# Patient Record
Sex: Female | Born: 1980 | Race: Black or African American | Hispanic: No | Marital: Married | Smoking: Former smoker
Health system: Southern US, Community
[De-identification: ages and names within clinical notes are randomized; demographics above are authoritative.]

## PROBLEM LIST (undated history)

## (undated) ENCOUNTER — Inpatient Hospital Stay (HOSPITAL_COMMUNITY): Payer: Self-pay

## (undated) DIAGNOSIS — N946 Dysmenorrhea, unspecified: Secondary | ICD-10-CM

## (undated) DIAGNOSIS — R29898 Other symptoms and signs involving the musculoskeletal system: Secondary | ICD-10-CM

## (undated) DIAGNOSIS — H532 Diplopia: Secondary | ICD-10-CM

## (undated) DIAGNOSIS — Z789 Other specified health status: Secondary | ICD-10-CM

## (undated) DIAGNOSIS — R200 Anesthesia of skin: Secondary | ICD-10-CM

## (undated) DIAGNOSIS — G43909 Migraine, unspecified, not intractable, without status migrainosus: Secondary | ICD-10-CM

## (undated) HISTORY — PX: OTHER SURGICAL HISTORY: SHX169

## (undated) HISTORY — PX: DILATION AND CURETTAGE OF UTERUS: SHX78

## (undated) HISTORY — DX: Anesthesia of skin: R20.0

## (undated) HISTORY — PX: ROTATOR CUFF REPAIR: SHX139

## (undated) HISTORY — DX: Migraine, unspecified, not intractable, without status migrainosus: G43.909

## (undated) HISTORY — PX: BREAST LUMPECTOMY: SHX2

## (undated) HISTORY — DX: Dysmenorrhea, unspecified: N94.6

## (undated) HISTORY — DX: Diplopia: H53.2

## (undated) HISTORY — DX: Other symptoms and signs involving the musculoskeletal system: R29.898

---

## 2001-05-12 ENCOUNTER — Emergency Department (HOSPITAL_COMMUNITY): Admission: EM | Admit: 2001-05-12 | Discharge: 2001-05-13 | Payer: Self-pay | Admitting: Internal Medicine

## 2002-05-24 ENCOUNTER — Other Ambulatory Visit: Admission: RE | Admit: 2002-05-24 | Discharge: 2002-05-24 | Payer: Self-pay | Admitting: Obstetrics and Gynecology

## 2003-07-29 ENCOUNTER — Emergency Department (HOSPITAL_COMMUNITY): Admission: EM | Admit: 2003-07-29 | Discharge: 2003-07-29 | Payer: Self-pay

## 2003-10-23 ENCOUNTER — Emergency Department (HOSPITAL_COMMUNITY): Admission: EM | Admit: 2003-10-23 | Discharge: 2003-10-23 | Payer: Self-pay | Admitting: Emergency Medicine

## 2004-01-11 ENCOUNTER — Emergency Department (HOSPITAL_COMMUNITY): Admission: EM | Admit: 2004-01-11 | Discharge: 2004-01-11 | Payer: Self-pay | Admitting: Emergency Medicine

## 2004-11-02 ENCOUNTER — Emergency Department (HOSPITAL_COMMUNITY): Admission: EM | Admit: 2004-11-02 | Discharge: 2004-11-02 | Payer: Self-pay | Admitting: Emergency Medicine

## 2005-01-13 ENCOUNTER — Emergency Department (HOSPITAL_COMMUNITY): Admission: EM | Admit: 2005-01-13 | Discharge: 2005-01-13 | Payer: Self-pay | Admitting: Emergency Medicine

## 2005-11-27 ENCOUNTER — Other Ambulatory Visit: Admission: RE | Admit: 2005-11-27 | Discharge: 2005-11-27 | Payer: Self-pay | Admitting: Family Medicine

## 2006-03-21 ENCOUNTER — Emergency Department (HOSPITAL_COMMUNITY): Admission: EM | Admit: 2006-03-21 | Discharge: 2006-03-21 | Payer: Self-pay | Admitting: Emergency Medicine

## 2006-12-14 ENCOUNTER — Emergency Department (HOSPITAL_COMMUNITY): Admission: EM | Admit: 2006-12-14 | Discharge: 2006-12-14 | Payer: Self-pay | Admitting: Emergency Medicine

## 2006-12-18 ENCOUNTER — Inpatient Hospital Stay (HOSPITAL_COMMUNITY): Admission: AD | Admit: 2006-12-18 | Discharge: 2006-12-18 | Payer: Self-pay | Admitting: Obstetrics and Gynecology

## 2007-01-20 ENCOUNTER — Inpatient Hospital Stay (HOSPITAL_COMMUNITY): Admission: AD | Admit: 2007-01-20 | Discharge: 2007-01-20 | Payer: Self-pay | Admitting: Obstetrics and Gynecology

## 2007-01-26 ENCOUNTER — Ambulatory Visit: Payer: Self-pay | Admitting: Obstetrics and Gynecology

## 2007-01-26 ENCOUNTER — Encounter (INDEPENDENT_AMBULATORY_CARE_PROVIDER_SITE_OTHER): Payer: Self-pay | Admitting: *Deleted

## 2007-01-26 ENCOUNTER — Ambulatory Visit (HOSPITAL_COMMUNITY): Admission: RE | Admit: 2007-01-26 | Discharge: 2007-01-26 | Payer: Self-pay | Admitting: Obstetrics and Gynecology

## 2007-02-11 ENCOUNTER — Ambulatory Visit: Payer: Self-pay | Admitting: Family Medicine

## 2008-02-06 ENCOUNTER — Inpatient Hospital Stay (HOSPITAL_COMMUNITY): Admission: AD | Admit: 2008-02-06 | Discharge: 2008-02-06 | Payer: Self-pay | Admitting: Obstetrics & Gynecology

## 2008-03-16 ENCOUNTER — Ambulatory Visit (HOSPITAL_COMMUNITY): Admission: RE | Admit: 2008-03-16 | Discharge: 2008-03-16 | Payer: Self-pay | Admitting: Obstetrics

## 2008-06-16 ENCOUNTER — Inpatient Hospital Stay (HOSPITAL_COMMUNITY): Admission: AD | Admit: 2008-06-16 | Discharge: 2008-06-16 | Payer: Self-pay | Admitting: Obstetrics

## 2008-08-07 ENCOUNTER — Inpatient Hospital Stay (HOSPITAL_COMMUNITY): Admission: AD | Admit: 2008-08-07 | Discharge: 2008-08-07 | Payer: Self-pay | Admitting: Obstetrics

## 2008-08-10 ENCOUNTER — Inpatient Hospital Stay (HOSPITAL_COMMUNITY): Admission: AD | Admit: 2008-08-10 | Discharge: 2008-08-12 | Payer: Self-pay | Admitting: Obstetrics & Gynecology

## 2009-11-28 ENCOUNTER — Encounter: Admission: RE | Admit: 2009-11-28 | Discharge: 2009-11-28 | Payer: Self-pay | Admitting: Obstetrics

## 2010-05-29 ENCOUNTER — Encounter: Admission: RE | Admit: 2010-05-29 | Discharge: 2010-05-29 | Payer: Self-pay | Admitting: Obstetrics

## 2010-07-04 ENCOUNTER — Ambulatory Visit (HOSPITAL_BASED_OUTPATIENT_CLINIC_OR_DEPARTMENT_OTHER): Admission: RE | Admit: 2010-07-04 | Discharge: 2010-07-04 | Payer: Self-pay | Admitting: General Surgery

## 2011-01-02 LAB — CULTURE, ROUTINE-ABSCESS

## 2011-01-02 LAB — ANAEROBIC CULTURE

## 2011-03-07 NOTE — Op Note (Signed)
Shirley [name], Shirley [name]               ACCOUNT NO.:  [id number]   MEDICAL RECORD NO.:  [id number]          PATIENT TYPE:  AMB   LOCATION:  [facility]C                           FACILITY:  [facility]   PHYSICIAN:  Paticia Stack, MD     DATE OF BIRTH:  [birth year]   DATE OF PROCEDURE:  01/26/2007  DATE OF DISCHARGE:                               OPERATIVE REPORT   PREOPERATIVE DIAGNOSIS:  Missed abortion.   POSTOPERATIVE DIAGNOSIS:  Missed abortion.   PROCEDURE:  Suction D&E.   SURGEON:  Dr. Argentina Donovan   ASSISTANT:  Dr. Wilburt Finlay   ANESTHESIA:  Paracervical block and MAC.   SPECIMENS:  Products of conception sent to pathology.   ESTIMATED BLOOD LOSS:  Minimal.   COMPLICATIONS:  None.   FINDINGS:  A 10 weeks' size anteverted uterus with moderate products of  conception.   DESCRIPTION OF PROCEDURE:  The patient was taken to the operating room  where she was placed under MAC anesthesia. She was then placed in the  dorsal lithotomy position and prepped and draped in a normal sterile  fashion. A sterile speculum was placed in the patient's vagina and the  cervix was noted to be dilated to 1 cm. The anterior lip of the cervix  was grasped with a tenaculum. A total of 10 mL of Xylocaine was injected  at 3 and 9 o'clock position of the cervix to produce a paracervical  block. The uterine fundus was then gently sounded to 9 cm and then  serially dilated to an 8 Hegar.  An 8 mm suction curette was then gently  to the uterine fundus.  The suction device was activated and curette  rotated to clear the uterus of the products of conception. Three passes  were made with excellent results noted. There was minimal bleeding noted  and the tenaculum was removed with excellent hemostasis noted.  The  patient tolerated the procedure well and was taken to the recovery room  in stable condition.           ______________________________  Paticia Stack, MD     LNJ/MEDQ  D:  01/26/2007  T:  01/26/2007   Job:  [id number]

## 2011-03-07 NOTE — Group Therapy Note (Signed)
NAMEBRIEL, [name] NO.:  [id number]   MEDICAL RECORD NO.:  [id number]          PATIENT TYPE:  WOC   LOCATION:  [facility]                   FACILITY:  [facility]   PHYSICIAN:  Tinnie Gens, MD        DATE OF BIRTH:  August 11, 1981   DATE OF SERVICE:                                  CLINIC NOTE   CHIEF COMPLAINT:  Follow-up D&C for missed AB.   HISTORY OF PRESENT ILLNESS:  The patient is a 30 year old gravida 3,  para 1-0-2-1 who had a missed AB at 10 weeks, underwent D&C without  difficulty and comes in today for follow-up.  She has had no specific  bleeding.  No significant pain.   PAST MEDICAL HISTORY:  Negative.   PAST SURGICAL HISTORY:  1. D&C.  2. Shoulder repair.   MEDICATIONS:  None.   ALLERGIES:  NONE KNOWN.   FAMILY HISTORY:  Significant for stroke in her mother and hypertension  in her mother.   SOCIAL HISTORY:  Is social alcohol user on the weekends.  No tobacco, no  other drug use.   Fourteen point review of systems reviewed and is negative.   OBSTETRICAL HISTORY:  She has had one previous vaginal delivery.   GYNECOLOGICAL HISTORY:  No history of abnormal Pap smear.  Last Pap was  November 27, 2005.  Menarche at age 56, cycles are regular every 29 days,  lasts for approximately 5 days with medium pain, moderate flow.   EXAM:  Today weight 123 pounds, blood pressure 111/78, pulse is 80,  temperature 99.9.  She is a very thin black female, in no acute  distress.  ABDOMEN:  Soft, nontender, nondistended.  GU:  Normal external female genitalia.  Vagina is pink and rugated.  BUS  is normal.  Uterus is small, anteverted.  Cervix is posterior and  closed.  Uterus is nontender.  Adnexa without mass or tenderness.   Pathology is reviewed and shows products of conception, fetal tissue,  villi, and decidua.   IMPRESSION:  Status post missed AB.   PLAN:  Released back to regular activity.  Advised to use backup method  until her next cycle, she will  start NuvaRing at that time.  Advised not  to get pregnant for approximately three cycles.  Also explained ABs,  their usual cause, and normal history.           ______________________________  Tinnie Gens, MD     TP/MEDQ  D:  02/11/2007  T:  02/11/2007  Job:  [id number]

## 2011-07-15 LAB — WET PREP, GENITAL
Clue Cells Wet Prep HPF POC: NONE SEEN
Trich, Wet Prep: NONE SEEN
Yeast Wet Prep HPF POC: NONE SEEN

## 2011-07-15 LAB — POCT PREGNANCY, URINE
Operator id: 28886
Preg Test, Ur: POSITIVE

## 2011-07-15 LAB — URINALYSIS, ROUTINE W REFLEX MICROSCOPIC
Bilirubin Urine: NEGATIVE
Hgb urine dipstick: NEGATIVE
Specific Gravity, Urine: 1.025

## 2011-07-15 LAB — GC/CHLAMYDIA PROBE AMP, GENITAL
Chlamydia, DNA Probe: NEGATIVE
GC Probe Amp, Genital: NEGATIVE

## 2011-07-21 LAB — CBC
HCT: 29 — ABNORMAL LOW
MCHC: 33.2
MCV: 100.8 — ABNORMAL HIGH
MCV: 101.9 — ABNORMAL HIGH
Platelets: 188
RBC: 3.73 — ABNORMAL LOW
RDW: 14.6
RDW: 14.7
WBC: 8.6

## 2012-10-14 ENCOUNTER — Emergency Department (HOSPITAL_COMMUNITY)
Admission: EM | Admit: 2012-10-14 | Discharge: 2012-10-15 | Disposition: A | Discharge status: Home / Self Care | Payer: Medicaid Other | Attending: Emergency Medicine | Admitting: Emergency Medicine

## 2012-10-14 ENCOUNTER — Encounter (HOSPITAL_COMMUNITY): Payer: Self-pay | Admitting: *Deleted

## 2012-10-14 DIAGNOSIS — M79662 Pain in left lower leg: Secondary | ICD-10-CM

## 2012-10-14 DIAGNOSIS — M79609 Pain in unspecified limb: Secondary | ICD-10-CM | POA: Insufficient documentation

## 2012-10-14 DIAGNOSIS — M2550 Pain in unspecified joint: Secondary | ICD-10-CM | POA: Insufficient documentation

## 2012-10-14 NOTE — ED Notes (Signed)
Pt ambulates with steady gait to exam room.

## 2012-10-14 NOTE — ED Notes (Signed)
Pt [redacted] wks pregnant; c/o left shin pain x 2 days; pain shooting down to foot with ambulation; no obvious injury; ambulating without difficulty; no redness or swelling

## 2012-10-15 NOTE — ED Provider Notes (Signed)
History     CSN: [id number]  Arrival date & time 10/14/12  2232   First MD Initiated Contact with Patient 10/14/12 2307      No chief complaint on file.   (Consider location/radiation/quality/duration/timing/severity/associated sxs/prior treatment) HPI Shirley [name] is a 31 y.o. female who presents with complaint left lower leg pain. States pains started yesterday. No injury. Pain in anterior shin. Tender to palpation. Tender with walking. States walks a lot. Exercises daily. Denies running or pounding. No hx of the same. Pain shooting into arch of the foot. No rash, bruising, swelling, redness noted. No hx of the same. No swelling of the foot. No pain in the calf. Pt about [redacted]wks pregnant.  History reviewed. No pertinent past medical history.  Past Surgical History  Procedure Date  . Rotator cuff repair   . Breast lumpectomy     No family history on file.  History  Substance Use Topics  . Smoking status: Never Smoker   . Smokeless tobacco: Not on file  . Alcohol Use: No    OB History    Grav Para Term Preterm Abortions TAB SAB Ect Mult Living   1               Review of Systems  Constitutional: Negative for fever and chills.  Respiratory: Negative.   Cardiovascular: Negative.   Musculoskeletal: Positive for arthralgias.  Skin: Negative for color change and rash.  Neurological: Negative for weakness and numbness.    Allergies  Review of patient's allergies indicates no known allergies.  Home Medications   Current Outpatient Rx  Name  Route  Sig  Dispense  Refill  . PRENATAL MULTIVITAMIN CH   Oral   Take 1 tablet by mouth every morning.           BP 143/98  Pulse 84  Temp 98.1 F (36.7 C) (Oral)  Resp 20  Ht 5\' 10"  (1.778 m)  Wt 150 lb (68.04 kg)  BMI 21.52 kg/m2  SpO2 100%  Physical Exam  Nursing note and vitals reviewed. Constitutional: She is oriented to person, place, and time. She appears well-developed and well-nourished. No distress.    Cardiovascular: Normal rate, regular rhythm and normal heart sounds.   Pulmonary/Chest: Effort normal and breath sounds normal. No respiratory distress. She has no wheezes. She has no rales.  Musculoskeletal:       Normal appearing left leg with no swelling, redness, bruising to the left lower leg. No calf tenderness. Tender to the anterior middle portion of the shin over tibialis anterior muscle. Pain with foot dorsiflexion against resistance. 5/5 and equal strength with plantar and dorsiflexion of the foot. No abscess, no induration, no swelling noted. Dorsal pedal pulses.   Neurological: She is alert and oriented to person, place, and time.  Skin: Skin is warm and dry.  Psychiatric: She has a normal mood and affect. Her behavior is normal.    ED Course  Procedures (including critical care time)  Labs Reviewed - No data to display No results found.   1. Pain of left lower leg       MDM  Pt with non traumatic pain over tibialis anterior muscle from anterior shin into the arch of the foot where it attaches. She is afebrile, non toxic, doubt infection or deep abscess. Suspect either a muscle strain or shin splints. Pt is tender to even mild palpation over the skin, question possible early shingles. No rash seen however. Warning given to return if develops  rash. No imaging necessary at this time.         Lottie Mussel, PA 10/15/12 [phone]

## 2012-10-15 NOTE — ED Provider Notes (Signed)
Medical screening examination/treatment/procedure(s) were performed by non-physician practitioner and as supervising physician I was immediately available for consultation/collaboration.   Argyle Gustafson M Delman Goshorn, MD 10/15/12 1535 

## 2012-10-20 NOTE — L&D Delivery Note (Signed)
Delivery Note At 3:27 AM a viable female was delivered via Vaginal, Spontaneous Delivery (Presentation: Vertex - LOA ;  ).  APGAR: 8 - 9 , ; weight: 3765 grams .   Placenta status: Intact, Spontaneous.  Cord: 3 vessels with the following complications: None.  Cord pH: none  Anesthesia: Epidural  Episiotomy: None Lacerations: 2nd degree;Perineal Suture Repair: 2.0 chromic Est. Blood Loss (mL): 350  Mom to postpartum.  Baby to nursery-stable.  HARPER,CHARLES A 06/12/2013, 3:53 AM

## 2012-11-03 LAB — OB RESULTS CONSOLE HGB/HCT, BLOOD: Hemoglobin: 12.5 g/dL

## 2012-11-03 LAB — OB RESULTS CONSOLE PLATELET COUNT: Platelets: 232 10*3/uL

## 2012-11-03 LAB — OB RESULTS CONSOLE ABO/RH

## 2012-11-03 LAB — OB RESULTS CONSOLE RUBELLA ANTIBODY, IGM: Rubella: IMMUNE

## 2012-11-03 LAB — OB RESULTS CONSOLE ANTIBODY SCREEN: Antibody Screen: NEGATIVE

## 2012-11-03 LAB — OB RESULTS CONSOLE GC/CHLAMYDIA: Gonorrhea: NEGATIVE

## 2013-01-04 ENCOUNTER — Other Ambulatory Visit: Payer: Self-pay | Admitting: Obstetrics

## 2013-01-04 DIAGNOSIS — Z369 Encounter for antenatal screening, unspecified: Secondary | ICD-10-CM

## 2013-01-08 ENCOUNTER — Encounter (HOSPITAL_COMMUNITY): Payer: Self-pay | Admitting: Obstetrics and Gynecology

## 2013-01-08 ENCOUNTER — Inpatient Hospital Stay (HOSPITAL_COMMUNITY)
Admission: AD | Admit: 2013-01-08 | Discharge: 2013-01-08 | Disposition: A | Discharge status: Home / Self Care | Payer: Medicaid Other | Source: Ambulatory Visit | Attending: Obstetrics | Admitting: Obstetrics

## 2013-01-08 DIAGNOSIS — O99891 Other specified diseases and conditions complicating pregnancy: Secondary | ICD-10-CM | POA: Insufficient documentation

## 2013-01-08 DIAGNOSIS — N949 Unspecified condition associated with female genital organs and menstrual cycle: Secondary | ICD-10-CM

## 2013-01-08 DIAGNOSIS — R109 Unspecified abdominal pain: Secondary | ICD-10-CM | POA: Insufficient documentation

## 2013-01-08 LAB — URINALYSIS, ROUTINE W REFLEX MICROSCOPIC
Ketones, ur: NEGATIVE mg/dL
Leukocytes, UA: NEGATIVE
Protein, ur: NEGATIVE mg/dL

## 2013-01-08 NOTE — MAU Provider Note (Signed)
Chief Complaint  Patient presents with  . Abdominal Pain    S: Shirley [name] is a 32 y.o. [id number] at [redacted]w[redacted]d  presenting with several day hx of sharp crampy pain starting above umbilicus and now in right and left lower abdomen and groin. Now feels like pressure. The pain is not exacerbated by walking and changing positions. No dysuria, urgency or frequency. She denies contractions, vaginal bleeding or leakage of fluid. Fetus is active.  ROS: Negative except as noted above.  PN course uncomplicated  O: Filed Vitals:   01/08/13 1612  BP: 135/81  Pulse: 95  Temp: 98 F (36.7 C)  Resp: 18    Gen: NAD Abd: soft, mildly tender in lower abd and groin Back: neg CVAT Cx: L/C/H FHR:150 DT UCs: none  Results for orders placed during the hospital encounter of 01/08/13 (from the past 24 hour(s))  URINALYSIS, ROUTINE W REFLEX MICROSCOPIC     Status: None   Collection Time    01/08/13  4:13 PM      Result Value Range   Color, Urine YELLOW  YELLOW   APPearance CLEAR  CLEAR   Specific Gravity, Urine 1.025  1.005 - 1.030   pH 6.0  5.0 - 8.0   Glucose, UA NEGATIVE  NEGATIVE mg/dL   Hgb urine dipstick NEGATIVE  NEGATIVE   Bilirubin Urine NEGATIVE  NEGATIVE   Ketones, ur NEGATIVE  NEGATIVE mg/dL   Protein, ur NEGATIVE  NEGATIVE mg/dL   Urobilinogen, UA 0.2  0.0 - 1.0 mg/dL   Nitrite NEGATIVE  NEGATIVE   Leukocytes, UA NEGATIVE  NEGATIVE    ASSESSMENT: [id number] at [redacted]w[redacted]d Round Ligament Pain  PLAN:   Reassurance given and general relief measures reviewed: avoid precipitating movements, instructions on abdominal tightening/pelvic rock exercises, abdominal binder, rest with hip flexion. Follow-up Information   Follow up with HARPER,CHARLES A, MD On 01/18/2013.   Contact information:   [address] [place] [address] [phone]

## 2013-01-08 NOTE — MAU Note (Signed)
Shirley [name] is here today with abdominal pain. She is [redacted]w[redacted]d; receives her care at [facility]. Says she was walking to the store and started having abdominal pain.

## 2013-01-14 ENCOUNTER — Encounter: Payer: Self-pay | Admitting: *Deleted

## 2013-01-18 ENCOUNTER — Encounter: Payer: Self-pay | Admitting: Obstetrics

## 2013-01-18 ENCOUNTER — Other Ambulatory Visit: Payer: Self-pay | Admitting: Obstetrics

## 2013-01-18 ENCOUNTER — Ambulatory Visit (INDEPENDENT_AMBULATORY_CARE_PROVIDER_SITE_OTHER): Payer: Medicaid Other

## 2013-01-18 ENCOUNTER — Other Ambulatory Visit: Payer: Self-pay

## 2013-01-18 ENCOUNTER — Ambulatory Visit (INDEPENDENT_AMBULATORY_CARE_PROVIDER_SITE_OTHER): Payer: Medicaid Other | Admitting: Obstetrics

## 2013-01-18 VITALS — BP 125/82 | Temp 97.2°F | Wt 157.0 lb

## 2013-01-18 DIAGNOSIS — Z3480 Encounter for supervision of other normal pregnancy, unspecified trimester: Secondary | ICD-10-CM

## 2013-01-18 DIAGNOSIS — Z369 Encounter for antenatal screening, unspecified: Secondary | ICD-10-CM

## 2013-01-18 LAB — POCT URINALYSIS DIPSTICK
Bilirubin, UA: NEGATIVE
Glucose, UA: NEGATIVE
Leukocytes, UA: NEGATIVE
Nitrite, UA: NEGATIVE
Urobilinogen, UA: NEGATIVE

## 2013-01-18 LAB — US OB DETAIL + 14 WK

## 2013-01-18 NOTE — Progress Notes (Signed)
Doing well 

## 2013-01-18 NOTE — Progress Notes (Signed)
Pulse-76 Ultrasound done prior to appointment.

## 2013-02-02 ENCOUNTER — Ambulatory Visit (INDEPENDENT_AMBULATORY_CARE_PROVIDER_SITE_OTHER): Payer: Medicaid Other | Admitting: Obstetrics

## 2013-02-02 VITALS — BP 125/82 | Temp 98.2°F | Wt 163.6 lb

## 2013-02-02 DIAGNOSIS — Z3480 Encounter for supervision of other normal pregnancy, unspecified trimester: Secondary | ICD-10-CM

## 2013-02-02 DIAGNOSIS — Z3482 Encounter for supervision of other normal pregnancy, second trimester: Secondary | ICD-10-CM

## 2013-02-02 LAB — POCT URINALYSIS DIPSTICK
Glucose, UA: NEGATIVE
Nitrite, UA: NEGATIVE
Protein, UA: NEGATIVE

## 2013-02-02 NOTE — Progress Notes (Signed)
Pulse: 76

## 2013-03-07 ENCOUNTER — Ambulatory Visit (INDEPENDENT_AMBULATORY_CARE_PROVIDER_SITE_OTHER): Payer: Medicaid Other | Admitting: Obstetrics

## 2013-03-07 ENCOUNTER — Other Ambulatory Visit: Payer: Medicaid Other | Admitting: *Deleted

## 2013-03-07 ENCOUNTER — Encounter: Payer: Self-pay | Admitting: Obstetrics

## 2013-03-07 VITALS — BP 127/83 | Temp 98.4°F | Wt 166.6 lb

## 2013-03-07 DIAGNOSIS — Z3480 Encounter for supervision of other normal pregnancy, unspecified trimester: Secondary | ICD-10-CM

## 2013-03-07 DIAGNOSIS — Z3482 Encounter for supervision of other normal pregnancy, second trimester: Secondary | ICD-10-CM

## 2013-03-07 DIAGNOSIS — J302 Other seasonal allergic rhinitis: Secondary | ICD-10-CM | POA: Insufficient documentation

## 2013-03-07 DIAGNOSIS — N949 Unspecified condition associated with female genital organs and menstrual cycle: Secondary | ICD-10-CM | POA: Insufficient documentation

## 2013-03-07 DIAGNOSIS — J309 Allergic rhinitis, unspecified: Secondary | ICD-10-CM

## 2013-03-07 DIAGNOSIS — K219 Gastro-esophageal reflux disease without esophagitis: Secondary | ICD-10-CM

## 2013-03-07 LAB — POCT URINALYSIS DIPSTICK
Blood, UA: NEGATIVE
Glucose, UA: NEGATIVE
Ketones, UA: NEGATIVE
Spec Grav, UA: 1.015
Urobilinogen, UA: NEGATIVE

## 2013-03-07 LAB — CBC
Hemoglobin: 11 g/dL — ABNORMAL LOW (ref 12.0–15.0)
MCH: 32.5 pg (ref 26.0–34.0)
MCHC: 34 g/dL (ref 30.0–36.0)
MCV: 95.9 fL (ref 78.0–100.0)
Platelets: 237 10*3/uL (ref 150–400)
RBC: 3.38 MIL/uL — ABNORMAL LOW (ref 3.87–5.11)

## 2013-03-07 MED ORDER — LORATADINE 10 MG PO TABS: 10.0000 mg | ORAL_TABLET | Freq: Every day | ORAL | Status: DC

## 2013-03-07 MED ORDER — OMEPRAZOLE 20 MG PO CPDR: DELAYED_RELEASE_CAPSULE | ORAL | Status: DC

## 2013-03-07 NOTE — Progress Notes (Signed)
Pulse- 94 

## 2013-03-07 NOTE — Addendum Note (Signed)
Addended by: Coral Ceo A on: 03/07/2013 12:57 PM   Modules accepted: Orders

## 2013-03-08 LAB — GLUCOSE TOLERANCE, 2 HOURS W/ 1HR
Glucose, 1 hour: 116 mg/dL (ref 70–170)
Glucose, Fasting: 57 mg/dL — ABNORMAL LOW (ref 70–99)

## 2013-03-08 LAB — HIV ANTIBODY (ROUTINE TESTING W REFLEX): HIV: NONREACTIVE

## 2013-03-21 ENCOUNTER — Ambulatory Visit (INDEPENDENT_AMBULATORY_CARE_PROVIDER_SITE_OTHER): Payer: Medicaid Other | Admitting: Obstetrics

## 2013-03-21 ENCOUNTER — Encounter: Payer: Self-pay | Admitting: Obstetrics

## 2013-03-21 VITALS — BP 132/76 | Temp 97.4°F | Wt 171.0 lb

## 2013-03-21 DIAGNOSIS — Z3480 Encounter for supervision of other normal pregnancy, unspecified trimester: Secondary | ICD-10-CM

## 2013-03-21 DIAGNOSIS — Z3483 Encounter for supervision of other normal pregnancy, third trimester: Secondary | ICD-10-CM

## 2013-03-21 DIAGNOSIS — Z3482 Encounter for supervision of other normal pregnancy, second trimester: Secondary | ICD-10-CM

## 2013-03-21 DIAGNOSIS — N390 Urinary tract infection, site not specified: Secondary | ICD-10-CM

## 2013-03-21 DIAGNOSIS — R319 Hematuria, unspecified: Secondary | ICD-10-CM

## 2013-03-21 LAB — POCT URINALYSIS DIPSTICK
Leukocytes, UA: NEGATIVE
Nitrite, UA: NEGATIVE
Urobilinogen, UA: NEGATIVE

## 2013-03-21 NOTE — Progress Notes (Signed)
Pulse-87  No complaints  

## 2013-03-23 ENCOUNTER — Encounter: Payer: Self-pay | Admitting: Obstetrics

## 2013-03-23 LAB — CULTURE, OB URINE: Colony Count: NO GROWTH

## 2013-03-29 ENCOUNTER — Encounter: Payer: Self-pay | Admitting: Obstetrics

## 2013-03-29 LAB — US OB DETAIL + 14 WK

## 2013-03-30 DIAGNOSIS — Z3480 Encounter for supervision of other normal pregnancy, unspecified trimester: Secondary | ICD-10-CM

## 2013-04-05 ENCOUNTER — Ambulatory Visit (INDEPENDENT_AMBULATORY_CARE_PROVIDER_SITE_OTHER): Payer: Medicaid Other | Admitting: Obstetrics

## 2013-04-05 ENCOUNTER — Encounter: Payer: Self-pay | Admitting: Obstetrics

## 2013-04-05 VITALS — BP 129/86 | Temp 98.1°F | Wt 175.4 lb

## 2013-04-05 DIAGNOSIS — Z3480 Encounter for supervision of other normal pregnancy, unspecified trimester: Secondary | ICD-10-CM

## 2013-04-05 DIAGNOSIS — R319 Hematuria, unspecified: Secondary | ICD-10-CM

## 2013-04-05 DIAGNOSIS — Z3483 Encounter for supervision of other normal pregnancy, third trimester: Secondary | ICD-10-CM

## 2013-04-05 LAB — POCT URINALYSIS DIPSTICK
Bilirubin, UA: NEGATIVE
Ketones, UA: NEGATIVE
Spec Grav, UA: 1.015
pH, UA: 6.5

## 2013-04-05 NOTE — Progress Notes (Signed)
Pulse-94 No complaints.  

## 2013-04-07 LAB — CULTURE, OB URINE: Colony Count: 90000

## 2013-04-19 ENCOUNTER — Ambulatory Visit (INDEPENDENT_AMBULATORY_CARE_PROVIDER_SITE_OTHER): Payer: Medicaid Other | Admitting: Obstetrics

## 2013-04-19 VITALS — BP 126/86 | Temp 97.4°F | Wt 179.0 lb

## 2013-04-19 DIAGNOSIS — Z3483 Encounter for supervision of other normal pregnancy, third trimester: Secondary | ICD-10-CM

## 2013-04-19 DIAGNOSIS — Z3480 Encounter for supervision of other normal pregnancy, unspecified trimester: Secondary | ICD-10-CM

## 2013-04-19 LAB — POCT URINALYSIS DIPSTICK
Glucose, UA: NEGATIVE
Leukocytes, UA: NEGATIVE
Nitrite, UA: NEGATIVE
pH, UA: 6

## 2013-04-19 NOTE — Progress Notes (Signed)
Pulse-80 Pt c/o intermittent pelvic pain x 2 days ago.

## 2013-05-05 ENCOUNTER — Encounter: Payer: Medicaid Other | Admitting: Obstetrics

## 2013-05-12 ENCOUNTER — Encounter: Payer: Self-pay | Admitting: Obstetrics

## 2013-05-12 ENCOUNTER — Ambulatory Visit (INDEPENDENT_AMBULATORY_CARE_PROVIDER_SITE_OTHER): Payer: Medicaid Other | Admitting: Obstetrics

## 2013-05-12 VITALS — BP 130/85 | Temp 98.2°F | Wt 186.0 lb

## 2013-05-12 DIAGNOSIS — Z3483 Encounter for supervision of other normal pregnancy, third trimester: Secondary | ICD-10-CM

## 2013-05-12 DIAGNOSIS — Z3480 Encounter for supervision of other normal pregnancy, unspecified trimester: Secondary | ICD-10-CM

## 2013-05-12 LAB — POCT URINALYSIS DIPSTICK
Bilirubin, UA: NEGATIVE
Blood, UA: NEGATIVE
Glucose, UA: NEGATIVE
Leukocytes, UA: NEGATIVE
Nitrite, UA: NEGATIVE
Urobilinogen, UA: NEGATIVE

## 2013-05-12 NOTE — Progress Notes (Signed)
Pulse- 87 

## 2013-05-19 ENCOUNTER — Encounter: Payer: Medicaid Other | Admitting: Obstetrics

## 2013-05-24 ENCOUNTER — Ambulatory Visit (INDEPENDENT_AMBULATORY_CARE_PROVIDER_SITE_OTHER): Payer: Medicaid Other | Admitting: Advanced Practice Midwife

## 2013-05-24 VITALS — BP 125/85 | Temp 98.1°F | Wt 191.0 lb

## 2013-05-24 DIAGNOSIS — Z3483 Encounter for supervision of other normal pregnancy, third trimester: Secondary | ICD-10-CM

## 2013-05-24 DIAGNOSIS — Z3480 Encounter for supervision of other normal pregnancy, unspecified trimester: Secondary | ICD-10-CM

## 2013-05-24 LAB — POCT URINALYSIS DIPSTICK
Nitrite, UA: NEGATIVE
Spec Grav, UA: 1.01
Urobilinogen, UA: NEGATIVE

## 2013-05-24 NOTE — Progress Notes (Signed)
Pulse: 84

## 2013-05-24 NOTE — Progress Notes (Signed)
Routine Obstetrical Visit  Subjective:    Shirley [name] is being seen today for her routine obstetrical visit. She is at [redacted]w[redacted]d gestation.   Patient reports no bleeding, no contractions, no cramping and no leaking. Denies PIH symptoms.   Objective:     Temp(Src) 98.1 F (36.7 C)  Wt 191 lb (86.637 kg)  BMI 27.41 kg/m2  FHR 140 FH 37 SVE 2/70/-3 posterior   Assessment:    Pregnancy: [id number] Patient Active Problem List   Diagnosis Date Noted  . Unspecified symptom associated with female genital organs 03/07/2013  . Allergic rhinitis, seasonal 03/07/2013  . GERD (gastroesophageal reflux disease) 03/07/2013       Plan:     Prenatal vitamins. Problem list reviewed and updated.  Patient reported she desires IOL @ 39 weeks if still pregnant as previously discussed w/ MD Clearance Coots. I instructed patient to meet with him next visit to discuss this plan. GBS today Follow up in 1 weeks. 80% of 15 min visit spent on counseling and coordination of care.     Jazper Nikolai 05/24/2013

## 2013-05-26 LAB — STREP B DNA PROBE: GBSP: NEGATIVE

## 2013-05-31 ENCOUNTER — Encounter: Payer: Self-pay | Admitting: Obstetrics

## 2013-05-31 ENCOUNTER — Ambulatory Visit (INDEPENDENT_AMBULATORY_CARE_PROVIDER_SITE_OTHER): Payer: Medicaid Other | Admitting: Obstetrics

## 2013-05-31 VITALS — BP 133/88 | Temp 97.5°F | Wt 192.5 lb

## 2013-05-31 DIAGNOSIS — Z3480 Encounter for supervision of other normal pregnancy, unspecified trimester: Secondary | ICD-10-CM

## 2013-05-31 DIAGNOSIS — Z3483 Encounter for supervision of other normal pregnancy, third trimester: Secondary | ICD-10-CM

## 2013-05-31 LAB — POCT URINALYSIS DIPSTICK
Bilirubin, UA: NEGATIVE
Blood, UA: NEGATIVE
Ketones, UA: NEGATIVE
Nitrite, UA: NEGATIVE
pH, UA: 7

## 2013-05-31 NOTE — Progress Notes (Signed)
Pulse-84 No complaints 

## 2013-06-04 ENCOUNTER — Encounter (HOSPITAL_COMMUNITY): Payer: Self-pay | Admitting: *Deleted

## 2013-06-04 ENCOUNTER — Inpatient Hospital Stay (HOSPITAL_COMMUNITY)
Admission: AD | Admit: 2013-06-04 | Discharge: 2013-06-04 | Disposition: A | Discharge status: Home / Self Care | Payer: Medicaid Other | Source: Ambulatory Visit | Attending: Obstetrics | Admitting: Obstetrics

## 2013-06-04 DIAGNOSIS — O479 False labor, unspecified: Secondary | ICD-10-CM | POA: Insufficient documentation

## 2013-06-04 NOTE — MAU Note (Signed)
PT SAYS SHE HURT BAD   SINCE 926PM,     VE IN OFFICE 2 WEEKS AGO-  3 CM.    DENIES HSV AND MRSA  GBS- NEG.

## 2013-06-07 ENCOUNTER — Ambulatory Visit (INDEPENDENT_AMBULATORY_CARE_PROVIDER_SITE_OTHER): Payer: Medicaid Other | Admitting: Obstetrics

## 2013-06-07 VITALS — BP 130/87 | Temp 98.1°F | Wt 197.0 lb

## 2013-06-07 DIAGNOSIS — Z3480 Encounter for supervision of other normal pregnancy, unspecified trimester: Secondary | ICD-10-CM

## 2013-06-07 DIAGNOSIS — Z3483 Encounter for supervision of other normal pregnancy, third trimester: Secondary | ICD-10-CM

## 2013-06-07 LAB — POCT URINALYSIS DIPSTICK
Bilirubin, UA: NEGATIVE
Blood, UA: NEGATIVE
Glucose, UA: NEGATIVE
Spec Grav, UA: 1.01

## 2013-06-07 NOTE — Progress Notes (Signed)
P-90 Pt states she had some spotting but it has stopped. Pt states she is having pain and pressure in her vagina.

## 2013-06-08 ENCOUNTER — Encounter: Payer: Self-pay | Admitting: Obstetrics

## 2013-06-12 ENCOUNTER — Encounter (HOSPITAL_COMMUNITY): Payer: Self-pay | Admitting: Anesthesiology

## 2013-06-12 ENCOUNTER — Inpatient Hospital Stay (HOSPITAL_COMMUNITY)
Admission: AD | Admit: 2013-06-12 | Discharge: 2013-06-13 | DRG: 775 | Disposition: A | Discharge status: Home / Self Care | Payer: Medicaid Other | Source: Ambulatory Visit | Attending: Obstetrics | Admitting: Obstetrics

## 2013-06-12 ENCOUNTER — Encounter (HOSPITAL_COMMUNITY): Payer: Self-pay | Admitting: *Deleted

## 2013-06-12 ENCOUNTER — Inpatient Hospital Stay (HOSPITAL_COMMUNITY): Payer: Medicaid Other | Admitting: Anesthesiology

## 2013-06-12 DIAGNOSIS — O139 Gestational [pregnancy-induced] hypertension without significant proteinuria, unspecified trimester: Principal | ICD-10-CM | POA: Diagnosis present

## 2013-06-12 LAB — CBC
HCT: 33.7 % — ABNORMAL LOW (ref 36.0–46.0)
MCH: 31.8 pg (ref 26.0–34.0)
MCH: 32.4 pg (ref 26.0–34.0)
MCHC: 33.8 g/dL (ref 30.0–36.0)
MCV: 93.9 fL (ref 78.0–100.0)
Platelets: 223 10*3/uL (ref 150–400)
Platelets: 200 10*3/uL (ref 150–400)
RBC: 3.3 MIL/uL — ABNORMAL LOW (ref 3.87–5.11)
RDW: 15.1 % (ref 11.5–15.5)
WBC: 10.1 10*3/uL (ref 4.0–10.5)
WBC: 15.4 10*3/uL — ABNORMAL HIGH (ref 4.0–10.5)

## 2013-06-12 MED ORDER — LACTATED RINGERS IV SOLN
INTRAVENOUS | Status: DC
  Administered 2013-06-12: 01:00:00 via INTRAVENOUS

## 2013-06-12 MED ORDER — BENZOCAINE-MENTHOL 20-0.5 % EX AERO
1.0000 "application " | INHALATION_SPRAY | CUTANEOUS | Status: DC | PRN
  Administered 2013-06-12 – 2013-06-13 (×2): 1 via TOPICAL
  Filled 2013-06-12 (×2): qty 56

## 2013-06-12 MED ORDER — ACETAMINOPHEN 325 MG PO TABS: 650.0000 mg | ORAL_TABLET | ORAL | Status: DC | PRN

## 2013-06-12 MED ORDER — PRENATAL MULTIVITAMIN CH
1.0000 | ORAL_TABLET | Freq: Every day | ORAL | Status: DC
  Administered 2013-06-12 – 2013-06-13 (×2): 1 via ORAL
  Filled 2013-06-12 (×2): qty 1

## 2013-06-12 MED ORDER — ZOLPIDEM TARTRATE 5 MG PO TABS: 5.0000 mg | ORAL_TABLET | Freq: Every evening | ORAL | Status: DC | PRN

## 2013-06-12 MED ORDER — EPHEDRINE 5 MG/ML INJ
10.0000 mg | INTRAVENOUS | Status: DC | PRN
  Filled 2013-06-12: qty 4
  Filled 2013-06-12: qty 2

## 2013-06-12 MED ORDER — LACTATED RINGERS IV SOLN: 500.0000 mL | INTRAVENOUS | Status: DC | PRN

## 2013-06-12 MED ORDER — FENTANYL 2.5 MCG/ML BUPIVACAINE 1/10 % EPIDURAL INFUSION (WH - ANES)
14.0000 mL/h | INTRAMUSCULAR | Status: DC | PRN
  Administered 2013-06-12: 16 mL/h via EPIDURAL
  Filled 2013-06-12: qty 125

## 2013-06-12 MED ORDER — IBUPROFEN 600 MG PO TABS
600.0000 mg | ORAL_TABLET | Freq: Four times a day (QID) | ORAL | Status: DC
  Administered 2013-06-12 – 2013-06-13 (×6): 600 mg via ORAL
  Filled 2013-06-12 (×6): qty 1

## 2013-06-12 MED ORDER — OXYTOCIN 40 UNITS IN LACTATED RINGERS INFUSION - SIMPLE MED
62.5000 mL/h | INTRAVENOUS | Status: DC
  Administered 2013-06-12: 62.5 mL/h via INTRAVENOUS
  Filled 2013-06-12: qty 1000

## 2013-06-12 MED ORDER — PHENYLEPHRINE 40 MCG/ML (10ML) SYRINGE FOR IV PUSH (FOR BLOOD PRESSURE SUPPORT)
80.0000 ug | PREFILLED_SYRINGE | INTRAVENOUS | Status: DC | PRN
  Filled 2013-06-12: qty 5
  Filled 2013-06-12: qty 2

## 2013-06-12 MED ORDER — DIPHENHYDRAMINE HCL 50 MG/ML IJ SOLN: 12.5000 mg | INTRAMUSCULAR | Status: DC | PRN

## 2013-06-12 MED ORDER — OXYTOCIN 40 UNITS IN LACTATED RINGERS INFUSION - SIMPLE MED: 62.5000 mL/h | INTRAVENOUS | Status: DC | PRN

## 2013-06-12 MED ORDER — LANOLIN HYDROUS EX OINT: TOPICAL_OINTMENT | CUTANEOUS | Status: DC | PRN

## 2013-06-12 MED ORDER — PHENYLEPHRINE 40 MCG/ML (10ML) SYRINGE FOR IV PUSH (FOR BLOOD PRESSURE SUPPORT)
80.0000 ug | PREFILLED_SYRINGE | INTRAVENOUS | Status: DC | PRN
  Filled 2013-06-12: qty 2

## 2013-06-12 MED ORDER — OXYCODONE-ACETAMINOPHEN 5-325 MG PO TABS: 1.0000 | ORAL_TABLET | ORAL | Status: DC | PRN

## 2013-06-12 MED ORDER — ONDANSETRON HCL 4 MG/2ML IJ SOLN: 4.0000 mg | Freq: Four times a day (QID) | INTRAMUSCULAR | Status: DC | PRN

## 2013-06-12 MED ORDER — IBUPROFEN 600 MG PO TABS: 600.0000 mg | ORAL_TABLET | Freq: Four times a day (QID) | ORAL | Status: DC | PRN

## 2013-06-12 MED ORDER — LIDOCAINE HCL (PF) 1 % IJ SOLN
30.0000 mL | INTRAMUSCULAR | Status: DC | PRN
  Administered 2013-06-12: 30 mL via SUBCUTANEOUS
  Filled 2013-06-12 (×2): qty 30

## 2013-06-12 MED ORDER — SENNOSIDES-DOCUSATE SODIUM 8.6-50 MG PO TABS
2.0000 | ORAL_TABLET | Freq: Every day | ORAL | Status: DC
  Administered 2013-06-12: 2 via ORAL

## 2013-06-12 MED ORDER — DIPHENHYDRAMINE HCL 25 MG PO CAPS: 25.0000 mg | ORAL_CAPSULE | Freq: Four times a day (QID) | ORAL | Status: DC | PRN

## 2013-06-12 MED ORDER — LACTATED RINGERS IV SOLN
500.0000 mL | Freq: Once | INTRAVENOUS | Status: AC
  Administered 2013-06-12: 500 mL via INTRAVENOUS

## 2013-06-12 MED ORDER — WITCH HAZEL-GLYCERIN EX PADS: 1.0000 "application " | MEDICATED_PAD | CUTANEOUS | Status: DC | PRN

## 2013-06-12 MED ORDER — OXYTOCIN BOLUS FROM INFUSION: 500.0000 mL | INTRAVENOUS | Status: DC

## 2013-06-12 MED ORDER — LIDOCAINE HCL (PF) 1 % IJ SOLN
INTRAMUSCULAR | Status: DC | PRN
  Administered 2013-06-12: 4 mL
  Administered 2013-06-12: 2 mL
  Administered 2013-06-12 (×3): 4 mL

## 2013-06-12 MED ORDER — SIMETHICONE 80 MG PO CHEW: 80.0000 mg | CHEWABLE_TABLET | ORAL | Status: DC | PRN

## 2013-06-12 MED ORDER — ONDANSETRON HCL 4 MG PO TABS: 4.0000 mg | ORAL_TABLET | ORAL | Status: DC | PRN

## 2013-06-12 MED ORDER — DIBUCAINE 1 % RE OINT: 1.0000 "application " | TOPICAL_OINTMENT | RECTAL | Status: DC | PRN

## 2013-06-12 MED ORDER — TETANUS-DIPHTH-ACELL PERTUSSIS 5-2.5-18.5 LF-MCG/0.5 IM SUSP: 0.5000 mL | Freq: Once | INTRAMUSCULAR | Status: DC

## 2013-06-12 MED ORDER — ONDANSETRON HCL 4 MG/2ML IJ SOLN: 4.0000 mg | INTRAMUSCULAR | Status: DC | PRN

## 2013-06-12 MED ORDER — OXYCODONE-ACETAMINOPHEN 5-325 MG PO TABS
1.0000 | ORAL_TABLET | ORAL | Status: DC | PRN
  Administered 2013-06-12 – 2013-06-13 (×4): 1 via ORAL
  Filled 2013-06-12 (×4): qty 1

## 2013-06-12 MED ORDER — CITRIC ACID-SODIUM CITRATE 334-500 MG/5ML PO SOLN: 30.0000 mL | ORAL | Status: DC | PRN

## 2013-06-12 MED ORDER — EPHEDRINE 5 MG/ML INJ
10.0000 mg | INTRAVENOUS | Status: DC | PRN
  Filled 2013-06-12: qty 2

## 2013-06-12 NOTE — MAU Note (Signed)
Contractions for 1.5hrs. No bleeding or leaking

## 2013-06-12 NOTE — Progress Notes (Signed)
Report called to [facility] in [facility]. Pt to BS via w/c for labor eval from triage.

## 2013-06-12 NOTE — Progress Notes (Signed)
Post Partum Day 0 Subjective: no complaints  Objective: Blood pressure 122/92, pulse 85, temperature 98.4 F (36.9 C), temperature source Oral, resp. rate 18, height 5\' 10"  (1.778 m), weight 196 lb 12.8 oz (89.268 kg), SpO2 99.00%, unknown if currently breastfeeding.  Physical Exam:  General: alert and no distress Lochia: appropriate Uterine Fundus: firm Incision: healing well DVT Evaluation: No evidence of DVT seen on physical exam.   Recent Labs  06/12/13 0040 06/12/13 0635  HGB 11.4* 10.7*  HCT 33.7* 31.4*    Assessment/Plan: Plan for discharge tomorrow   LOS: 0 days   Shirley [name] A 06/12/2013, 8:24 AM

## 2013-06-12 NOTE — Anesthesia Procedure Notes (Signed)
Epidural Patient location during procedure: OB Start time: 06/12/2013 1:17 AM  Staffing Performed by: anesthesiologist   Preanesthetic Checklist Completed: patient identified, site marked, surgical consent, pre-op evaluation, timeout performed, IV checked, risks and benefits discussed and monitors and equipment checked  Epidural Patient position: sitting Prep: site prepped and draped and DuraPrep Patient monitoring: continuous pulse ox and blood pressure Approach: midline Injection technique: LOR air  Needle:  Needle type: Tuohy  Needle gauge: 17 G Needle length: 9 cm and 9 Needle insertion depth: 4.5 cm Catheter type: closed end flexible Catheter size: 19 Gauge Catheter at skin depth: 9.5 cm Test dose: negative  Assessment Events: blood not aspirated, injection not painful, no injection resistance, negative IV test and no paresthesia  Additional Notes Discussed risk of headache, infection, bleeding, nerve injury and failed or incomplete block.  Patient voices understanding and wishes to proceed.  Epidural placed easily on first attempt.  No paresthesia. Patient tolerated procedure well with no apparent complications.  Jasmine December, MDReason for block:procedure for pain

## 2013-06-12 NOTE — Anesthesia Preprocedure Evaluation (Signed)
Anesthesia Evaluation  Patient identified by MRN, date of birth, ID band Patient awake    Reviewed: Allergy & Precautions, H&P , NPO status , Patient's Chart, lab work & pertinent test results, reviewed documented beta blocker date and time   History of Anesthesia Complications Negative for: history of anesthetic complications  Airway Mallampati: III TM Distance: >3 FB Neck ROM: full    Dental  (+) Teeth Intact   Pulmonary neg pulmonary ROS,  breath sounds clear to auscultation        Cardiovascular negative cardio ROS  Rhythm:regular Rate:Normal     Neuro/Psych negative neurological ROS  negative psych ROS   GI/Hepatic Neg liver ROS, GERD-  Medicated,  Endo/Other  negative endocrine ROS  Renal/GU negative Renal ROS     Musculoskeletal   Abdominal   Peds  Hematology negative hematology ROS (+)   Anesthesia Other Findings   Reproductive/Obstetrics (+) Pregnancy                           Anesthesia Physical Anesthesia Plan  ASA: II  Anesthesia Plan: Epidural   Post-op Pain Management:    Induction:   Airway Management Planned:   Additional Equipment:   Intra-op Plan:   Post-operative Plan:   Informed Consent: I have reviewed the patients History and Physical, chart, labs and discussed the procedure including the risks, benefits and alternatives for the proposed anesthesia with the patient or authorized representative who has indicated his/her understanding and acceptance.     Plan Discussed with:   Anesthesia Plan Comments:         Anesthesia Quick Evaluation  

## 2013-06-12 NOTE — Anesthesia Postprocedure Evaluation (Signed)
  Anesthesia Post-op Note  Patient: Shirley [name]  Procedure(s) Performed: * No procedures listed *  Patient Location: PACU and Mother/Baby  Anesthesia Type:Epidural  Level of Consciousness: awake, alert  and oriented  Airway and Oxygen Therapy: Patient Spontanous Breathing  Post-op Pain: mild  Post-op Assessment: Patient's Cardiovascular Status Stable, Respiratory Function Stable, Patent Airway, No signs of Nausea or vomiting and Pain level controlled  Post-op Vital Signs: stable  Complications: No apparent anesthesia complications

## 2013-06-12 NOTE — H&P (Signed)
Shirley [name] is a 32 y.o. female presenting for UC's. Maternal Medical History:  Reason for admission: Contractions.  32 yo G5 P2022.  EDC 06-20-13.  Presents with UC's.  Fetal activity: Perceived fetal activity is normal.   Last perceived fetal movement was within the past hour.    Prenatal complications: no prenatal complications Prenatal Complications - Diabetes: none.    OB History   Grav Para Term Preterm Abortions TAB SAB Ect Mult Living   5 2 2  2  0 1   2     History reviewed. No pertinent past medical history. Past Surgical History  Procedure Laterality Date  . Rotator cuff repair    . Breast lumpectomy    . Dilation and curettage of uterus    . Rods in feet     Family History: family history is not on file. Social History:  reports that she has never smoked. She does not have any smokeless tobacco history on file. She reports that she does not drink alcohol or use illicit drugs.   Prenatal Transfer Tool  Maternal Diabetes: No Genetic Screening: Normal Maternal Ultrasounds/Referrals: Normal Fetal Ultrasounds or other Referrals:  None Maternal Substance Abuse:  No Significant Maternal Medications:  None Significant Maternal Lab Results:  None Other Comments:  None  Review of Systems  All other systems reviewed and are negative.    Dilation: 7 Effacement (%): 100 Station: -2 Exam by:: A. Tuttle, RNC Blood pressure 155/85, pulse 71, temperature 97.7 F (36.5 C), resp. rate 22, height 5\' 10"  (1.778 m), weight 196 lb 12.8 oz (89.268 kg). Maternal Exam:  Abdomen: Patient reports no abdominal tenderness. Fetal presentation: vertex  Introitus: Normal vulva. Normal vagina.  Pelvis: adequate for delivery.   Cervix: Cervix evaluated by digital exam.     Physical Exam  Nursing note and vitals reviewed. Constitutional: She is oriented to person, place, and time. She appears well-developed and well-nourished.  HENT:  Head: Normocephalic and atraumatic.  Eyes:  Conjunctivae are normal. Pupils are equal, round, and reactive to light.  Neck: Normal range of motion. Neck supple.  Cardiovascular: Normal rate and regular rhythm.   Respiratory: Effort normal and breath sounds normal.  GI: Soft.  Genitourinary: Vagina normal and uterus normal.  Musculoskeletal: Normal range of motion.  Neurological: She is alert and oriented to person, place, and time.  Skin: Skin is warm and dry.  Psychiatric: She has a normal mood and affect. Her behavior is normal. Judgment and thought content normal.    Prenatal labs: ABO, Rh: O/Positive/-- (01/15 0000) Antibody: Negative (01/15 0000) Rubella: Immune (01/15 0000) RPR: NON REAC (05/19 1314)  HBsAg: Negative (01/15 0000)  HIV: NON REACTIVE (05/19 1314)  GBS: NEGATIVE (08/05 1333)   Assessment/Plan: 38 weeks.  Active labor.  Admit.   Kasia Trego A 06/12/2013, 12:44 AM

## 2013-06-13 DIAGNOSIS — O139 Gestational [pregnancy-induced] hypertension without significant proteinuria, unspecified trimester: Secondary | ICD-10-CM | POA: Diagnosis present

## 2013-06-13 LAB — COMPREHENSIVE METABOLIC PANEL
ALT: 16 U/L (ref 0–35)
AST: 26 U/L (ref 0–37)
Alkaline Phosphatase: 121 U/L — ABNORMAL HIGH (ref 39–117)
CO2: 22 mEq/L (ref 19–32)
Calcium: 9.4 mg/dL (ref 8.4–10.5)
GFR calc non Af Amer: >90 mL/min (ref >90)
Potassium: 3.9 mEq/L (ref 3.5–5.1)
Sodium: 134 mEq/L — ABNORMAL LOW (ref 135–145)

## 2013-06-13 MED ORDER — OXYCODONE-ACETAMINOPHEN 5-325 MG PO TABS: 1.0000 | ORAL_TABLET | ORAL | Status: DC | PRN

## 2013-06-13 NOTE — Progress Notes (Signed)
Ur chart review completed.  

## 2013-06-13 NOTE — Progress Notes (Signed)
Post Partum Day 2 Subjective: no complaints  Objective: Blood pressure 117/74, pulse 81, temperature 98 F (36.7 C), temperature source Oral, resp. rate 18, height 5\' 10"  (1.778 m), weight 196 lb 12.8 oz (89.268 kg), SpO2 99.00%, unknown if currently breastfeeding.  Physical Exam:  General: alert and no distress Lochia: appropriate Uterine Fundus: firm Incision: healing well DVT Evaluation: No evidence of DVT seen on physical exam.   Recent Labs  06/12/13 0040 06/12/13 0635  HGB 11.4* 10.7*  HCT 33.7* 31.4*    Assessment/Plan: Discharge home   LOS: 1 day   HARPER,CHARLES A 06/13/2013, 8:48 AM

## 2013-06-13 NOTE — Discharge Summary (Signed)
  Obstetric Discharge Summary Reason for Admission: onset of labor Prenatal Procedures: none Intrapartum Procedures: spontaneous vaginal delivery Postpartum Procedures: none Complications-Operative and Postpartum: none  Hemoglobin  Date Value Range Status  06/12/2013 10.7* 12.0 - 15.0 g/dL Final  3/76/2831 51.7   Final     HCT  Date Value Range Status  06/12/2013 31.4* 36.0 - 46.0 % Final  11/03/2012 35   Final    Physical Exam:  General: alert Lochia: appropriate Uterine: firm Incision: n/a DVT Evaluation: No evidence of DVT seen on physical exam.  Discharge Diagnoses: Active Problems:   Gestational hypertension   Normal delivery   Discharge Information: Date: 06/13/2013 Activity: pelvic rest Diet: routine Medications:  Prior to Admission medications   Medication Sig Start Date End Date Taking? Authorizing Provider  cholecalciferol (VITAMIN D) 1000 UNITS tablet Take 1,000 Units by mouth daily.   Yes Historical Provider, MD  loratadine (CLARITIN) 10 MG tablet Take 1 tablet (10 mg total) by mouth daily. 03/07/13  Yes Brock Bad, MD  Prenatal Vit-Fe Fumarate-FA (PRENATAL MULTIVITAMIN) TABS Take 1 tablet by mouth every morning.   Yes Historical Provider, MD  oxyCODONE-acetaminophen (PERCOCET/ROXICET) 5-325 MG per tablet Take 1-2 tablets by mouth every 4 (four) hours as needed. 06/13/13   Antionette Char, MD    Condition: stable Instructions: refer to routine discharge instructions Discharge to: home Follow-up Information   Follow up with HARPER,CHARLES A, MD. Schedule an appointment as soon as possible for a visit in 2 days. (For B/P check; make appointment w/Jackson-Moore in 2 weeks)    Specialty:  Obstetrics and Gynecology   Contact information:   [address] [place] [address] [phone]       Newborn Data: Live born  Information for the patient's newborn:  Ai, Sonnenfeld [[id number]]  female ; APGAR (1 MIN): 8   APGAR (5  MINS): 9    Home with mother.  JACKSON-MOORE,Rosina Cressler A 06/13/2013, 8:18 AM

## 2013-06-13 NOTE — Progress Notes (Signed)
Lab results called to Dr. Tamela Oddi. She gave verbal order to RN that patient is cleared  to discharge at this time.

## 2013-06-14 ENCOUNTER — Encounter: Payer: Medicaid Other | Admitting: Obstetrics

## 2013-06-15 ENCOUNTER — Ambulatory Visit (INDEPENDENT_AMBULATORY_CARE_PROVIDER_SITE_OTHER): Payer: Medicaid Other | Admitting: Obstetrics

## 2013-06-15 VITALS — BP 140/100 | HR 80 | Temp 97.2°F | Wt 185.0 lb

## 2013-06-15 DIAGNOSIS — I10 Essential (primary) hypertension: Secondary | ICD-10-CM

## 2013-06-15 MED ORDER — AMLODIPINE BESYLATE 5 MG PO TABS: 5.0000 mg | ORAL_TABLET | Freq: Every day | ORAL | Status: DC

## 2013-06-15 MED ORDER — TRIAMTERENE-HCTZ 37.5-25 MG PO CAPS: 1.0000 | ORAL_CAPSULE | ORAL | Status: DC

## 2013-06-15 MED ORDER — HYDROCODONE-ACETAMINOPHEN 7.5-300 MG PO TABS: 1.0000 | ORAL_TABLET | Freq: Four times a day (QID) | ORAL | Status: DC | PRN

## 2013-06-15 NOTE — Progress Notes (Signed)
Subjective:     Shirley [name] is a 32 y.o. female who presents for a postpartum visit. She is 3 days postpartum following a spontaneous vaginal delivery. I have fully reviewed the prenatal and intrapartum course. The delivery was at 38.6  gestational weeks. Outcome: spontaneous vaginal delivery. Anesthesia: epidural. Postpartum course has been normal. Baby's course has been normal. Baby is feeding by breast. Bleeding red. Bowel function is normal. Bladder function is normal. Patient is not sexually active. Contraception method is abstinence. Postpartum depression screening: negative.  The following portions of the patient's history were reviewed and updated as appropriate: allergies, current medications, past family history, past medical history, past social history, past surgical history and problem list.  Review of Systems Pertinent items are noted in HPI.   Objective:    BP 140/100  Pulse 80  Temp(Src) 97.2 F (36.2 C)  Wt 185 lb (83.915 kg)  BMI 26.54 kg/m2  Breastfeeding? Yes      Abdomen:  Soft, NT.   Pelvic:  Perineum clean, tender to palpation.                                Assessment:     Normal postpartum exam. Pap smear not done at today's visit.    Elevated BP Plan:    1. Contraception: none 2. Recommended sitz baths with Epsom Salts. 3. Follow up in: 2 weeks or as needed.   4. Norvasc and HCTZ Rx.

## 2013-06-29 ENCOUNTER — Ambulatory Visit (INDEPENDENT_AMBULATORY_CARE_PROVIDER_SITE_OTHER): Payer: Medicaid Other | Admitting: Obstetrics

## 2013-06-29 ENCOUNTER — Encounter: Payer: Self-pay | Admitting: Obstetrics

## 2013-06-29 MED ORDER — NORETHINDRONE 0.35 MG PO TABS: 1.0000 | ORAL_TABLET | Freq: Every day | ORAL | Status: DC

## 2013-06-29 NOTE — Progress Notes (Signed)
Subjective:     Shirley [name] is a 32 y.o. female who presents for a postpartum visit. She is 2 weeks postpartum following a spontaneous vaginal delivery. I have fully reviewed the prenatal and intrapartum course. The delivery was at 38 gestational weeks. Outcome: spontaneous vaginal delivery. Anesthesia: epidural. Postpartum course has been WNL. Baby's course has been WNL. Baby is feeding by breast. Bleeding thin lochia. Bowel function is normal. Bladder function is normal. Patient is not sexually active. Contraception method is abstinence. Postpartum depression screening: negative. Pt plans a BTL for contraception.   The following portions of the patient's history were reviewed and updated as appropriate: allergies, current medications, past family history, past medical history, past social history, past surgical history and problem list.  Review of Systems Pertinent items are noted in HPI.   Objective:    BP 115/87  Pulse 76  Temp(Src) 98.2 F (36.8 C)  Ht 5\' 10"  (1.778 m)  Wt 173 lb (78.472 kg)  BMI 24.82 kg/m2  Breastfeeding? Yes        Assessment:     Normal postpartum exam.   Plan:     Contraception: progestin only pill until laparoscopic BTL; declines complete salpingectomies; risks/benefits reviewed Follow up in: 1 month or as needed.

## 2013-06-29 NOTE — Patient Instructions (Signed)
Sterilization Information, Female Female sterilization is a procedure to permanently prevent pregnancy. There are different ways to perform sterilization, but all either block or close the fallopian tubes so that your eggs cannot reach your uterus. If your egg cannot reach your uterus, sperm cannot fertilize the egg, and you cannot get pregnant.  Sterilization is performed by a surgical procedure. Sometimes these procedures are performed in a hospital while a patient is asleep. Sometimes they can be done in a clinic setting with the patient awake. The fallopian tubes can be surgically cut, tied, or sealed through a procedure called tubal ligation. The fallopian tubes can also be closed with clips or rings. Sterilization can also be done by placing a tiny coil into each fallopian tube, which causes scar tissue to grow inside the tube. The scar tissue then blocks the tubes.  Discuss sterilization with your caregiver to answer any concerns you or your partner may have. You may want to ask what type of sterilization your caregiver performs. Some caregivers may not perform all the various options. Sterilization is permanent and should only be done if you are sure you do not want children or do not want any more children. Having a sterilization reversed may not be successful.  STERILIZATION PROCEDURES  Laparoscopic sterilization. This is a surgical method performed at a time other than right after childbirth. Two incisions are made in the lower abdomen. A thin, lighted tube (laparoscope) is inserted into one of the incisions and is used to perform the procedure. The fallopian tubes are closed with a ring or a clip. An instrument that uses heat could be used to seal the tubes closed (electrocautery).   Mini-laparotomy. This is a surgical method done 1 or 2 days after giving birth. Typically, a small incision is made just below the belly button (umbilicus) and the fallopian tubes are exposed. The tubes can then be  sealed, tied, or cut.   Hysteroscopic sterilization. This is performed at a time other than right after childbirth. A tiny, spring-like coil is inserted through the cervix and uterus and placed into the fallopian tubes. The coil causes scaring and blocks the tubes. Other forms of contraception should be used for 3 months after the procedure to allow the scar tissue to form completely. Additionally, it is required hysterosalpingography be done 3 months later to ensure that the procedure was successful. Hysterosalpingography is a procedure that uses X-rays to look at your uterus and fallopian tubes after a material to make them show up better has been inserted. IS STERILIZATION SAFE? Sterilization is considered safe with very rare complications. Risks depend on the type of procedure you have. As with any surgical procedure, there are risks. Some risks of sterilization by any means include:   Bleeding.  Infection.  Reaction to anesthesia medicine.  Injury to surrounding organs. Risks specific to having hysteroscopic coils placed include:  The coils may not be placed correctly the first time.   The coils may move out of place.   The tubes may not get completely blocked after 3 months.   Injury to surrounding organs when placing the coil.  HOW EFFECTIVE IS FEMALE STERILIZATION? Sterilization is nearly 100% effective, but it can fail. Depending on the type of sterilization, the rate of failure can be as high as 3%. After hysteroscopic sterilization with placement of fallopian tube coils, you will need back-up birth control for 3 months after the procedure. Sterilization is effective for a lifetime.  BENEFITS OF STERILIZATION  It does   not affect your hormones, and therefore will not affect your menstrual periods, sexual desire, or performance.   It is effective for a lifetime.   It is safe.   You do not need to worry about getting pregnant. Keep in mind that if you had the  hysteroscopic placement procedure, you must wait 3 months after the procedure (or until your caregiver confirms) before pregnancy is not considered possible.   There are no side effects unlike other types of birth control (contraception).  DRAWBACKS OF STERILIZATION  You must be sure you do not want children or any more children. The procedure is permanent.   It does not provide protection against sexually transmitted infections (STIs).   The tubes can grow back together. If this happens, there is a risk of pregnancy. There is also an increased risk (50%) of pregnancy being an ectopic pregnancy. This is a pregnancy that happens outside of the uterus. Document Released: 03/24/2008 Document Revised: 04/06/2012 Document Reviewed: 01/22/2012 [employer] Patient Information 2014 [employer].  

## 2013-07-08 ENCOUNTER — Encounter: Payer: Self-pay | Admitting: Obstetrics & Gynecology

## 2013-07-08 ENCOUNTER — Other Ambulatory Visit: Payer: Self-pay | Admitting: *Deleted

## 2013-07-15 ENCOUNTER — Encounter (HOSPITAL_COMMUNITY): Payer: Self-pay | Admitting: Pharmacist

## 2013-07-27 ENCOUNTER — Encounter (HOSPITAL_COMMUNITY): Payer: Self-pay

## 2013-07-27 ENCOUNTER — Encounter (HOSPITAL_COMMUNITY)
Admission: RE | Admit: 2013-07-27 | Discharge: 2013-07-27 | Disposition: A | Discharge status: Home / Self Care | Payer: Medicaid Other | Source: Ambulatory Visit | Attending: Obstetrics & Gynecology | Admitting: Obstetrics & Gynecology

## 2013-07-27 DIAGNOSIS — Z01812 Encounter for preprocedural laboratory examination: Secondary | ICD-10-CM | POA: Insufficient documentation

## 2013-07-27 HISTORY — DX: Other specified health status: Z78.9

## 2013-07-27 LAB — CBC
Hemoglobin: 11.9 g/dL — ABNORMAL LOW (ref 12.0–15.0)
MCH: 30.9 pg (ref 26.0–34.0)
MCHC: 34 g/dL (ref 30.0–36.0)
RDW: 14.2 % (ref 11.5–15.5)

## 2013-07-27 NOTE — Patient Instructions (Signed)
Your procedure is scheduled on: 08/05/13  Enter through the Main Entrance at :0730 am Pick up desk phone and dial [phone] and inform us of your arrival.  Please call [phone] if you have any problems the morning of surgery.  Remember: Do not eat food or drink liquids, including water, after midnight: Thursday   You may brush your teeth the morning of surgery.   DO NOT wear jewelry, eye make-up, lipstick,body lotion, or dark fingernail polish.  (Polished toes are ok) You may wear deodorant.  If you are to be admitted after surgery, leave suitcase in car until your room has been assigned. Patients discharged on the day of surgery will not be allowed to drive home. Wear loose fitting, comfortable clothes for your ride home.

## 2013-08-01 ENCOUNTER — Ambulatory Visit: Payer: Medicaid Other | Admitting: Obstetrics

## 2013-08-05 ENCOUNTER — Encounter (HOSPITAL_COMMUNITY): Payer: Medicaid Other

## 2013-08-05 ENCOUNTER — Encounter (HOSPITAL_COMMUNITY): Payer: Self-pay

## 2013-08-05 ENCOUNTER — Ambulatory Visit (HOSPITAL_COMMUNITY): Payer: Medicaid Other

## 2013-08-05 ENCOUNTER — Ambulatory Visit (HOSPITAL_COMMUNITY)
Admission: RE | Admit: 2013-08-05 | Discharge: 2013-08-05 | Disposition: A | Discharge status: Home / Self Care | Payer: Medicaid Other | Source: Ambulatory Visit | Attending: Obstetrics & Gynecology | Admitting: Obstetrics & Gynecology

## 2013-08-05 ENCOUNTER — Encounter (HOSPITAL_COMMUNITY)
Admission: RE | Disposition: A | Discharge status: Home / Self Care | Payer: Self-pay | Source: Ambulatory Visit | Attending: Obstetrics & Gynecology

## 2013-08-05 DIAGNOSIS — Z302 Encounter for sterilization: Secondary | ICD-10-CM | POA: Insufficient documentation

## 2013-08-05 DIAGNOSIS — Z641 Problems related to multiparity: Secondary | ICD-10-CM | POA: Insufficient documentation

## 2013-08-05 HISTORY — PX: LAPAROSCOPIC TUBAL LIGATION: SHX1937

## 2013-08-05 LAB — PREGNANCY, URINE: Preg Test, Ur: NEGATIVE

## 2013-08-05 SURGERY — LIGATION, FALLOPIAN TUBE, LAPAROSCOPIC
Anesthesia: General | Site: Abdomen | Laterality: Bilateral | Wound class: Clean

## 2013-08-05 MED ORDER — LIDOCAINE HCL (CARDIAC) 20 MG/ML IV SOLN
INTRAVENOUS | Status: DC | PRN
  Administered 2013-08-05: 20 mg via INTRAVENOUS

## 2013-08-05 MED ORDER — DEXAMETHASONE SODIUM PHOSPHATE 10 MG/ML IJ SOLN
INTRAMUSCULAR | Status: AC
  Filled 2013-08-05: qty 1

## 2013-08-05 MED ORDER — GLYCOPYRROLATE 0.2 MG/ML IJ SOLN
INTRAMUSCULAR | Status: DC | PRN
  Administered 2013-08-05: 0.4 mg via INTRAVENOUS
  Administered 2013-08-05: 0.2 mg via INTRAVENOUS

## 2013-08-05 MED ORDER — GLYCOPYRROLATE 0.2 MG/ML IJ SOLN
INTRAMUSCULAR | Status: AC
  Filled 2013-08-05: qty 3

## 2013-08-05 MED ORDER — KETOROLAC TROMETHAMINE 30 MG/ML IJ SOLN
15.0000 mg | Freq: Once | INTRAMUSCULAR | Status: AC | PRN
  Administered 2013-08-05: 30 mg via INTRAVENOUS

## 2013-08-05 MED ORDER — FENTANYL CITRATE 0.05 MG/ML IJ SOLN
INTRAMUSCULAR | Status: AC
  Filled 2013-08-05: qty 5

## 2013-08-05 MED ORDER — OXYCODONE-ACETAMINOPHEN 5-325 MG PO TABS: 2.0000 | ORAL_TABLET | Freq: Four times a day (QID) | ORAL | Status: DC | PRN

## 2013-08-05 MED ORDER — BUPIVACAINE HCL (PF) 0.25 % IJ SOLN
INTRAMUSCULAR | Status: AC
  Filled 2013-08-05: qty 30

## 2013-08-05 MED ORDER — BUPIVACAINE HCL (PF) 0.25 % IJ SOLN
INTRAMUSCULAR | Status: DC | PRN
  Administered 2013-08-05: 6 mL

## 2013-08-05 MED ORDER — ROCURONIUM BROMIDE 100 MG/10ML IV SOLN
INTRAVENOUS | Status: DC | PRN
  Administered 2013-08-05: 30 mg via INTRAVENOUS

## 2013-08-05 MED ORDER — PROPOFOL 10 MG/ML IV BOLUS
INTRAVENOUS | Status: DC | PRN
  Administered 2013-08-05: 200 mg via INTRAVENOUS

## 2013-08-05 MED ORDER — LACTATED RINGERS IV SOLN
INTRAVENOUS | Status: DC
  Administered 2013-08-05 (×3): via INTRAVENOUS

## 2013-08-05 MED ORDER — 0.9 % SODIUM CHLORIDE (POUR BTL) OPTIME
TOPICAL | Status: DC | PRN
  Administered 2013-08-05: 1000 mL

## 2013-08-05 MED ORDER — FENTANYL CITRATE 0.05 MG/ML IJ SOLN
INTRAMUSCULAR | Status: DC | PRN
  Administered 2013-08-05: 100 ug via INTRAVENOUS
  Administered 2013-08-05: 50 ug via INTRAVENOUS
  Administered 2013-08-05: 100 ug via INTRAVENOUS

## 2013-08-05 MED ORDER — ACETAMINOPHEN 160 MG/5ML PO SOLN
975.0000 mg | Freq: Once | ORAL | Status: AC
Start: 2013-08-05 — End: 2013-08-05
  Administered 2013-08-05: 975 mg via ORAL

## 2013-08-05 MED ORDER — GLYCOPYRROLATE 0.2 MG/ML IJ SOLN
INTRAMUSCULAR | Status: AC
  Filled 2013-08-05: qty 1

## 2013-08-05 MED ORDER — ONDANSETRON HCL 4 MG/2ML IJ SOLN
INTRAMUSCULAR | Status: AC
  Filled 2013-08-05: qty 2

## 2013-08-05 MED ORDER — MIDAZOLAM HCL 2 MG/2ML IJ SOLN
INTRAMUSCULAR | Status: DC | PRN
  Administered 2013-08-05: 2 mg via INTRAVENOUS

## 2013-08-05 MED ORDER — MIDAZOLAM HCL 2 MG/2ML IJ SOLN
INTRAMUSCULAR | Status: AC
  Filled 2013-08-05: qty 2

## 2013-08-05 MED ORDER — KETOROLAC TROMETHAMINE 30 MG/ML IJ SOLN
INTRAMUSCULAR | Status: AC
  Filled 2013-08-05: qty 1

## 2013-08-05 MED ORDER — METOCLOPRAMIDE HCL 5 MG/ML IJ SOLN: 10.0000 mg | Freq: Once | INTRAMUSCULAR | Status: DC | PRN

## 2013-08-05 MED ORDER — ACETAMINOPHEN 160 MG/5ML PO SOLN
ORAL | Status: AC
  Administered 2013-08-05: 975 mg via ORAL
  Filled 2013-08-05: qty 40.6

## 2013-08-05 MED ORDER — ONDANSETRON HCL 4 MG/2ML IJ SOLN
INTRAMUSCULAR | Status: DC | PRN
  Administered 2013-08-05: 4 mg via INTRAMUSCULAR

## 2013-08-05 MED ORDER — LIDOCAINE HCL (CARDIAC) 20 MG/ML IV SOLN
INTRAVENOUS | Status: AC
  Filled 2013-08-05: qty 5

## 2013-08-05 MED ORDER — PROPOFOL 10 MG/ML IV EMUL
INTRAVENOUS | Status: AC
  Filled 2013-08-05: qty 20

## 2013-08-05 MED ORDER — FENTANYL CITRATE 0.05 MG/ML IJ SOLN: 25.0000 ug | INTRAMUSCULAR | Status: DC | PRN

## 2013-08-05 MED ORDER — NEOSTIGMINE METHYLSULFATE 1 MG/ML IJ SOLN
INTRAMUSCULAR | Status: AC
  Filled 2013-08-05: qty 1

## 2013-08-05 MED ORDER — DEXAMETHASONE SODIUM PHOSPHATE 10 MG/ML IJ SOLN
INTRAMUSCULAR | Status: DC | PRN
  Administered 2013-08-05: 10 mg via INTRAVENOUS

## 2013-08-05 MED ORDER — NEOSTIGMINE METHYLSULFATE 1 MG/ML IJ SOLN
INTRAMUSCULAR | Status: DC | PRN
  Administered 2013-08-05: 3 mg via INTRAVENOUS

## 2013-08-05 MED ORDER — MEPERIDINE HCL 25 MG/ML IJ SOLN: 6.2500 mg | INTRAMUSCULAR | Status: DC | PRN

## 2013-08-05 SURGICAL SUPPLY — 20 items
ADH SKN CLS APL DERMABOND .7 (GAUZE/BANDAGES/DRESSINGS) ×1
CATH ROBINSON RED A/P 16FR (CATHETERS) ×2 IMPLANT
CHLORAPREP W/TINT 26ML (MISCELLANEOUS) ×2 IMPLANT
CLOTH BEACON ORANGE TIMEOUT ST (SAFETY) ×2 IMPLANT
DERMABOND ADVANCED (GAUZE/BANDAGES/DRESSINGS) ×1
DERMABOND ADVANCED .7 DNX12 (GAUZE/BANDAGES/DRESSINGS) ×1 IMPLANT
GLOVE BIO SURGEON STRL SZ 6.5 (GLOVE) ×4 IMPLANT
GLOVE BIOGEL PI IND STRL 7.0 (GLOVE) IMPLANT
GLOVE BIOGEL PI INDICATOR 7.0 (GLOVE) ×1
GLOVE SURG SS PI 7.0 STRL IVOR (GLOVE) ×1 IMPLANT
GOWN PREVENTION PLUS LG XLONG (DISPOSABLE) ×3 IMPLANT
GOWN PREVENTION PLUS XLARGE (GOWN DISPOSABLE) ×1 IMPLANT
PACK LAPAROSCOPY BASIN (CUSTOM PROCEDURE TRAY) ×2 IMPLANT
SCRUB PCMX 4 OZ (MISCELLANEOUS) ×2 IMPLANT
SUT VIC AB 3-0 PS2 18 (SUTURE)
SUT VIC AB 3-0 PS2 18XBRD (SUTURE) IMPLANT
SUT VICRYL 0 UR6 27IN ABS (SUTURE) ×1 IMPLANT
TOWEL OR 17X24 6PK STRL BLUE (TOWEL DISPOSABLE) ×4 IMPLANT
TROCAR XCEL NON-BLD 11X100MML (ENDOMECHANICALS) ×2 IMPLANT
WATER STERILE IRR 1000ML POUR (IV SOLUTION) ×1 IMPLANT

## 2013-08-05 NOTE — Discharge Instructions (Signed)
Tubal Ligation Care After Please read the instructions outlined below and refer to this sheet in the next few weeks. These discharge instructions give you general information on care after leaving the hospital. Your surgeon may also give you specific instructions. While treatment has been planned according to the most current medical practices available, unavoidable complications sometimes happen. If you have any problems or questions after discharge, please call your surgeon or caregiver.  No ibuprofen containing products until after 5 pm this evening HOME CARE INSTRUCTIONS  Do not drink alcohol, drive a car, use public transportation, or sign important papers for at least 1 day following surgery.   Only take over-the-counter or prescription medicines for pain, discomfort, or fever as directed by your caregiver.   If your surgery was done as a same-day surgery, make sure you have a responsible adult with you for the first 24 hours following your surgery.   You may resume normal diet and activities as directed.   Change dressings as directed.   Do not douche or engage in sexual intercourse until your surgeon has given you permission.  SEEK MEDICAL CARE IF:   You have redness, swelling, or increasing pain in the wound or wound area.   You have pus coming from the wound.   You have a fever.   You have a bad smell coming from a wound or dressing.   Your wound edges separate after sutures or staples have been removed.   You have increasing abdominal pain.   You have excessive vaginal bleeding.   You have increasing pain in your shoulders or chest.   You have dizzy episodes or fainting while standing.   You have persistent nausea or vomiting.  SEEK IMMEDIATE MEDICAL CARE IF:   You have a rash.   You have a hard time breathing.   You feel you are developing any reaction or side effects to medications taken.  MAKE SURE YOU:   Understand these instructions.   Will watch this  condition.   Will get help right away if you are not doing well or get worse.    Document Released: 07/11/2004 Document Revised: 09/25/2011 Document Reviewed: 12/30/2007 [facility] Patient Information 2012 [place], [place].

## 2013-08-05 NOTE — Op Note (Signed)
Procedure Note  Shirley [name] 32 y.o. 08/05/2013  Preoperative Diagnosis:  Multiparity, desires a sterilization procedure  Postoperative Diagnosis: Same  Procedure: Laparoscopic bilateral tubal ligation with fulguration  Surgeon: Antionette Char A  Indications:  The patient now presents for a sterilization procedure after discussing therapeutic alternatives.        Procedure Detail:  The patient was taken to the operating room and was placed on the operating table in the dorsal supine position.  After his satisfactory general anesthesia was achieved, the patient was placed in the semi-lithotomy position using Allen stirrups. The patient was prepped and draped in the usual sterile manner for vaginal laparoscopic procedure. A speculum was placed in the vagina. The anterior lip of the cervix was grasped with a single-tooth tenaculum. A Hulka manipulator was then advanced into the uterus and secured  to the anterior lip of the cervix as a means to manipulate the uterus. The single-tooth tenaculum and Hulka manipulator were then removed. The infraumbilical region was then anesthetized with local anesthesia, 0.25%  Marcaine. A small incision was made to the skin and subcutaneous tissue. A 10 mm Optiview trocar was placed through the incision into the abdominal cavity diagnostic laparoscope with video camera attached was placed through the trocar sleeve and carbon dioxide was used to insufflate the abdominal and pelvic cavity. The pelvic contents were examined and the findings were described below. Kleppinger bipolar forceps were inserted through the operative port of the laparoscope. The left fallopian tube was identified and traced out to its fimbriated end. Then starting at the distal isthmus the proximal ampullary portion of the tube was coagulated in 4 contiguous areas. Each time the resistance meter went to 0 and the tube had been retracted away from an adjacent viscera. The right fallopian tube  was then manipulated in a similar fashion. The uterine fundus was perforated with manipulator.  The site was was coagulated with the Kleppinger bipolar forceps.  Adequate hemostasis was noted.  The scope was removed and the excess carbon dioxide was remove through the port, before it was removed.  The subcutaneous layer of the incision was reapproximated with an interrupted figure-of eight 0-Vicryl suture on a UR 6 needle. A skin adhesive was applied to the incision.  The instruments were removed from the vagina and there was minimal bleeding from the cervix. Final sponge, instrument and needle counts were correct. The patient was awakened on the operating table and taken to the PACU in satisfactory condition.    Findings: Normal pelvic anatomy  Estimated Blood Loss:  Minimal              Total IV Fluids: per Anesthesiology        Condition: stable

## 2013-08-05 NOTE — Anesthesia Postprocedure Evaluation (Signed)
  Anesthesia Post-op Note  Anesthesia Post Note  Patient: Shirley [name]  Procedure(s) Performed: Procedure(s) (LRB): LAPAROSCOPIC TUBAL LIGATION (Bilateral)  Anesthesia type: General  Patient location: PACU  Post pain: Pain level controlled  Post assessment: Post-op Vital signs reviewed  Last Vitals:  Filed Vitals:   08/05/13 1145  BP: 139/88  Pulse: 59  Temp: 36.6 C  Resp: 16    Post vital signs: Reviewed  Level of consciousness: sedated  Complications: No apparent anesthesia complications

## 2013-08-05 NOTE — Anesthesia Procedure Notes (Signed)
Procedure Name: Intubation Date/Time: 08/05/2013 9:40 AM Performed by: Antionette Char Pre-anesthesia Checklist: Patient identified, Emergency Drugs available, Suction available, Patient being monitored and Timeout performed Patient Re-evaluated:Patient Re-evaluated prior to inductionOxygen Delivery Method: Circle system utilized Preoxygenation: Pre-oxygenation with 100% oxygen Intubation Type: IV induction Ventilation: Mask ventilation without difficulty Laryngoscope Size: Miller and 2 Grade View: Grade I Tube size: 7.0 mm Number of attempts: 1 Airway Equipment and Method: Stylet Placement Confirmation: ETT inserted through vocal cords under direct vision,  positive ETCO2 and breath sounds checked- equal and bilateral Secured at: 22 cm Tube secured with: Tape Dental Injury: Teeth and Oropharynx as per pre-operative assessment

## 2013-08-05 NOTE — H&P (Signed)
  Chief Complaint: 32 y.o.  who presents for a laparoscopic BTL  Details of Present Illness: See above.  BP 141/92  Pulse 70  Temp(Src) 97.9 F (36.6 C) (Oral)  Resp 20  SpO2 100%  Past Medical History  Diagnosis Date  . Medical history non-contributory    History   Social History  . Marital Status: Single    Spouse Name: N/A    Number of Children: N/A  . Years of Education: N/A   Occupational History  . Not on file.   Social History Main Topics  . Smoking status: Never Smoker   . Smokeless tobacco: Not on file  . Alcohol Use: No  . Drug Use: No  . Sexual Activity: No   Other Topics Concern  . Not on file   Social History Narrative  . No narrative on file   No family history on file.  Pertinent items are noted in HPI.  Pre-Op Diagnosis: desires sterilization   Planned Procedure: Procedure(s): LAPAROSCOPIC TUBAL LIGATION  I have reviewed the patient's history and have completed the physical exam and Shirley [name] is acceptable for surgery.  Roseanna Rainbow, MD 08/05/2013 8:53 AM

## 2013-08-05 NOTE — Anesthesia Preprocedure Evaluation (Addendum)
Anesthesia Evaluation  Patient identified by MRN, date of birth, ID band Patient awake    Reviewed: Allergy & Precautions, H&P , NPO status , Patient's Chart, lab work & pertinent test results, reviewed documented beta blocker date and time   History of Anesthesia Complications Negative for: history of anesthetic complications  Airway Mallampati: III TM Distance: >3 FB Neck ROM: full    Dental  (+) Teeth Intact   Pulmonary neg pulmonary ROS,  breath sounds clear to auscultation        Cardiovascular negative cardio ROS  Rhythm:regular Rate:Normal     Neuro/Psych negative neurological ROS  negative psych ROS   GI/Hepatic negative GI ROS, Neg liver ROS,   Endo/Other  negative endocrine ROS  Renal/GU negative Renal ROS     Musculoskeletal   Abdominal   Peds  Hematology negative hematology ROS (+)   Anesthesia Other Findings   Reproductive/Obstetrics (+) Breast feeding                           Anesthesia Physical Anesthesia Plan  ASA: I  Anesthesia Plan: General ETT   Post-op Pain Management:    Induction:   Airway Management Planned:   Additional Equipment:   Intra-op Plan:   Post-operative Plan:   Informed Consent: I have reviewed the patients History and Physical, chart, labs and discussed the procedure including the risks, benefits and alternatives for the proposed anesthesia with the patient or authorized representative who has indicated his/her understanding and acceptance.     Plan Discussed with: Surgeon and CRNA  Anesthesia Plan Comments:        Anesthesia Quick Evaluation

## 2013-08-05 NOTE — Transfer of Care (Signed)
Immediate Anesthesia Transfer of Care Note  Patient: Shirley [name]  Procedure(s) Performed: Procedure(s): LAPAROSCOPIC TUBAL LIGATION (Bilateral)  Patient Location: PACU  Anesthesia Type:General  Level of Consciousness: awake, alert  and oriented  Airway & Oxygen Therapy: Patient Spontanous Breathing and Patient connected to nasal cannula oxygen  Post-op Assessment: Report given to PACU RN and Post -op Vital signs reviewed and stable  Post vital signs: Reviewed and stable  Complications: No apparent anesthesia complications

## 2013-08-08 ENCOUNTER — Encounter (HOSPITAL_COMMUNITY): Payer: Self-pay | Admitting: Obstetrics & Gynecology

## 2014-02-19 ENCOUNTER — Encounter (HOSPITAL_COMMUNITY): Payer: Self-pay | Admitting: Emergency Medicine

## 2014-02-19 ENCOUNTER — Emergency Department (HOSPITAL_COMMUNITY): Payer: Medicaid Other

## 2014-02-19 ENCOUNTER — Emergency Department (HOSPITAL_COMMUNITY)
Admission: EM | Admit: 2014-02-19 | Discharge: 2014-02-19 | Disposition: A | Discharge status: Home / Self Care | Payer: Medicaid Other | Attending: Emergency Medicine | Admitting: Emergency Medicine

## 2014-02-19 DIAGNOSIS — IMO0002 Reserved for concepts with insufficient information to code with codable children: Secondary | ICD-10-CM | POA: Insufficient documentation

## 2014-02-19 DIAGNOSIS — Y9301 Activity, walking, marching and hiking: Secondary | ICD-10-CM | POA: Insufficient documentation

## 2014-02-19 DIAGNOSIS — X500XXA Overexertion from strenuous movement or load, initial encounter: Secondary | ICD-10-CM | POA: Insufficient documentation

## 2014-02-19 DIAGNOSIS — Z79899 Other long term (current) drug therapy: Secondary | ICD-10-CM | POA: Insufficient documentation

## 2014-02-19 DIAGNOSIS — S8392XA Sprain of unspecified site of left knee, initial encounter: Secondary | ICD-10-CM

## 2014-02-19 DIAGNOSIS — Y929 Unspecified place or not applicable: Secondary | ICD-10-CM | POA: Insufficient documentation

## 2014-02-19 MED ORDER — IBUPROFEN 600 MG PO TABS: 600.0000 mg | ORAL_TABLET | Freq: Four times a day (QID) | ORAL | Status: DC | PRN

## 2014-02-19 NOTE — ED Provider Notes (Signed)
CSN: [id number]     Arrival date & time 02/19/14  1145 History  This chart was scribed for non-physician practitioner, Raymon MuttonMarissa Rawan Riendeau, PA-C working with Suzi RootsKevin E Steinl, MD by Greggory StallionKayla Andersen, ED scribe. This patient was seen in room WTR9/WTR9 and the patient's care was started at 1:38 PM.   Chief Complaint  Patient presents with  . Knee Pain   The history is provided by the patient. No language interpreter was used.   HPI Comments: Shirley [name] is a 33 y.o. female who presents to the Emergency Department complaining of gradual onset right knee pain with associated swelling that started 2 weeks ago. She states she was walking and felt a pop in her knee. Pt didn't think anything of it because her joints pop all of the time. Denies fall or injury. She has taken ibuprofen with little relief. Certain movements and bearing weight worsen the pain. Denies numbness or tingling. Pt used to run track and is unsure if she has prior injury. States she had a baby 8 months ago and is not currently pregnant. Pt has no allergies.   Past Medical History  Diagnosis Date  . Medical history non-contributory    Past Surgical History  Procedure Laterality Date  . Rotator cuff repair    . Breast lumpectomy    . Dilation and curettage of uterus    . Rods in feet    . Laparoscopic tubal ligation Bilateral 08/05/2013    Procedure: LAPAROSCOPIC TUBAL LIGATION;  Surgeon: Antionette CharLisa Jackson-Moore, MD;  Location: [facility];  Service: Gynecology;  Laterality: Bilateral;   No family history on file. History  Substance Use Topics  . Smoking status: Never Smoker   . Smokeless tobacco: Not on file  . Alcohol Use: No   OB History   Grav Para Term Preterm Abortions TAB SAB Ect Mult Living   5 3 3  2  0 1   3     Review of Systems  Musculoskeletal: Positive for arthralgias and joint swelling.  Neurological: Negative for numbness.  All other systems reviewed and are negative.  Allergies  Review of patient's allergies  indicates no known allergies.  Home Medications   Prior to Admission medications   Medication Sig Start Date End Date Taking? Authorizing Provider  oxyCODONE-acetaminophen (PERCOCET) 5-325 MG per tablet Take 2 tablets by mouth every 6 (six) hours as needed for pain. 08/05/13   Antionette CharLisa Jackson-Moore, MD  Prenatal Vit-Fe Fumarate-FA (PRENATAL MULTIVITAMIN) TABS Take 1 tablet by mouth every morning.    Historical Provider, MD   BP 143/91  Pulse 81  Temp(Src) 98.2 F (36.8 C) (Oral)  Resp 16  Ht 5\' 10"  (1.778 m)  SpO2 100%  LMP 02/09/2014  Physical Exam  Nursing note and vitals reviewed. Constitutional: She is oriented to person, place, and time. She appears well-developed and well-nourished. No distress.  HENT:  Head: Normocephalic and atraumatic.  Eyes: Conjunctivae and EOM are normal. Pupils are equal, round, and reactive to light. Right eye exhibits no discharge. Left eye exhibits no discharge.  Neck: Normal range of motion. Neck supple. No tracheal deviation present.  Cardiovascular: Normal rate, regular rhythm and normal heart sounds.   Pulses:      Radial pulses are 2+ on the right side, and 2+ on the left side.  Pulmonary/Chest: Effort normal and breath sounds normal. No respiratory distress. She has no wheezes. She has no rhonchi. She has no rales.  Musculoskeletal: She exhibits tenderness.       Left  knee: She exhibits bony tenderness. She exhibits normal range of motion, no swelling, no effusion, no ecchymosis, no deformity, no laceration and no erythema. Tenderness found.       Legs: Identified to the anterior aspect of the left knee. Negative erythema, inflammation, warmth upon palpation, septic findings. Negative findings of cellulitis. Negative deformities identified. Patella palpated without laxity. Full range of motion to the left knee identified-full flexion, extension. Discomfort upon palpation to the anterior aspect. Negative mobility of the patella identified upon  palpation. Negative valgus and varus tension. Negative crepitus upon palpation. Negative anterior and posterior draw sign. Stable left knee joint. Full range of motion to the left ankle, digits of left foot.  Lymphadenopathy:    She has no cervical adenopathy.  Neurological: She is alert and oriented to person, place, and time.  Cranial nerves III-XII grossly intact Strength 5+/5+ to upper and lower extremities bilaterally with resistance applied, equal distribution noted Strength intact to the digits of the left foot Sensation intact with differentiation sharp and dull touch Gait proper, proper balance - negative sway, negative drift, negative step-offs  Skin: Skin is warm and dry. She is not diaphoretic.  Psychiatric: She has a normal mood and affect. Her behavior is normal.    ED Course  Procedures (including critical care time)  DIAGNOSTIC STUDIES: Oxygen Saturation is 100% on RA, normal by my interpretation.    COORDINATION OF CARE: 1:40 PM-Discussed treatment plan which includes knee sleeve and RICE with pt at bedside and pt agreed to plan. Will give pt orthopedic referral and advised her to follow up.   Labs Review Labs Reviewed - No data to display  Imaging Review Dg Knee Complete 4 Views Left  02/19/2014   CLINICAL DATA:  Knee pain and swelling  EXAM: LEFT KNEE - COMPLETE 4+ VIEW  COMPARISON:  None.  FINDINGS: There is no evidence of fracture, dislocation, or joint effusion. There is no evidence of arthropathy or other focal bone abnormality. Small joint effusion.  IMPRESSION: 1. Small joint effusion. 2. No fracture.   Electronically Signed   By: Signa Kellaylor  Stroud M.D.   On: 02/19/2014 12:43     EKG Interpretation None      MDM   Final diagnoses:  Left knee sprain    Filed Vitals:   02/19/14 1158  BP: 143/91  Pulse: 81  Temp: 98.2 F (36.8 C)  TempSrc: Oral  Resp: 16  Height: 5\' 10"  (1.778 m)  SpO2: 100%   I personally performed the services described in this  documentation, which was scribed in my presence. The recorded information has been reviewed and is accurate.  Plain films of left knee negative for acute osseous injury, dislocation. Small joint effusion identified. Mild swelling identified to the anterior aspect of the left knee. Negative findings of patellar tendon rupture or quadricep tendon rupture. Stable right knee joint. Full flexion extension noted without difficulty. Patient ambulates well. Patient neurovascularly intact. Negative focal neurological deficits noted.  Doubt patellar or quadriceps tendon rupture. Doubt tibial plateau fracture. Patient stable, afebrile. Discharged patient. Patient placed in knee sleeve for comfort. Referred patient to orthopedics. Discussed with patient to rest, ice, elevate. Discussed with patient why massage is and water therapy. Discussed with patient to closely monitor symptoms and if symptoms are to worsen or change to report back to the ED - strict return instructions given.  Patient agreed to plan of care, understood, all questions answered.   Raymon MuttonMarissa Feiga Nadel, PA-C 02/19/14 2103

## 2014-02-19 NOTE — Discharge Instructions (Signed)
Please call your doctor for a followup appointment within 24-48 hours. When you talk to your doctor please let them know that you were seen in the emergency department and have them acquire all of your records so that they can discuss the findings with you and formulate a treatment plan to fully care for your new and ongoing problems. Please call and set-up an appointment with orthopedics to be reassessed regarding ongoing left knee pain. Please apply ice, elevate and rest-when resting please keep knee in bed position to prevent further stiffening of the joint Please avoid any physical or strenuous activity Please massage with icy hot ointment While on ibuprofen please do not breast feed Please continue to monitor symptoms closely and if symptoms are to worsen or change (fever greater than 101, chills, chest pain, shortness of breath, difficulty breathing, numbness, tingling, fall, injury, swelling to the knee, worsening or changes to pain pattern, numbness or tingling, pain radiating down the leg, warmth to touch the knee, redness) please report back to the ED immediately  Knee Sprain A knee sprain is a tear in one of the strong, fibrous tissues that connect the bones (ligaments) in your knee. The severity of the sprain depends on how much of the ligament is torn. The tear can be either partial or complete. CAUSES  Often, sprains are a result of a fall or injury. The force of the impact causes the fibers of your ligament to stretch too much. This excess tension causes the fibers of your ligament to tear. SIGNS AND SYMPTOMS  You may have some loss of motion in your knee. Other symptoms include:  Bruising.  Pain in the knee area.  Tenderness of the knee to the touch.  Swelling. DIAGNOSIS  To diagnose a knee sprain, your health care provider will physically examine your knee. Your health care provider may also suggest an X-ray exam of your knee to make sure no bones are broken. TREATMENT  If  your ligament is only partially torn, treatment usually involves keeping the knee in a fixed position (immobilization) or bracing your knee for activities that require movement for several weeks. To do this, your health care provider will apply a bandage, cast, or splint to keep your knee from moving and to support your knee during movement until it heals. For a partially torn ligament, the healing process usually takes 4 6 weeks. If your ligament is completely torn, depending on which ligament it is, you may need surgery to reconnect the ligament to the bone or reconstruct it. After surgery, a cast or splint may be applied and will need to stay on your knee for 4 6 weeks while your ligament heals. HOME CARE INSTRUCTIONS  Keep your injured knee elevated to decrease swelling.  To ease pain and swelling, apply ice to the injured area:  Put ice in a plastic bag.  Place a towel between your skin and the bag.  Leave the ice on for 20 minutes, 2 3 times a day.  Only take medicine for pain as directed by your health care provider.  Do not leave your knee unprotected until pain and stiffness go away (usually 4 6 weeks).  If you have a cast or splint, do not allow it to get wet. If you have been instructed not to remove it, cover it with a plastic bag when you shower or bathe. Do not swim.  Your health care provider may suggest exercises for you to do during your recovery to prevent or  limit permanent weakness and stiffness. SEEK IMMEDIATE MEDICAL CARE IF:  Your cast or splint becomes damaged.  Your pain becomes worse.  You have significant pain, swelling, or numbness below the cast or splint. MAKE SURE YOU:  Understand these instructions.  Will watch your condition.  Will get help right away if you are not doing well or get worse. Document Released: 10/06/2005 Document Revised: 07/27/2013 Document Reviewed: 05/18/2013 [facility] Patient Information 2014 [employer], [place].   Emergency  Department Resource Guide 1) Find a Doctor and Pay Out of Pocket Although you won't have to find out who is covered by your insurance plan, it is a good idea to ask around and get recommendations. You will then need to call the office and see if the doctor you have chosen will accept you as a new patient and what types of options they offer for patients who are self-pay. Some doctors offer discounts or will set up payment plans for their patients who do not have insurance, but you will need to ask so you aren't surprised when you get to your appointment.  2) Contact Your Local Health Department Not all health departments have doctors that can see patients for sick visits, but many do, so it is worth a call to see if yours does. If you don't know where your local health department is, you can check in your phone book. The CDC also has a tool to help you locate your state's health department, and many state websites also have listings of all of their local health departments.  3) Find a Walk-in Clinic If your illness is not likely to be very severe or complicated, you may want to try a walk in clinic. These are popping up all over the country in pharmacies, drugstores, and shopping centers. They're usually staffed by nurse practitioners or physician assistants that have been trained to treat common illnesses and complaints. They're usually fairly quick and inexpensive. However, if you have serious medical issues or chronic medical problems, these are probably not your best option.  No Primary Care Doctor: - Call Health Connect at  [phone] - they can help you locate a primary care doctor that  accepts your insurance, provides certain services, etc. - Physician Referral Service- [phone]  Chronic Pain Problems: Organization         Address  Phone   Notes  [facility]  [phone] Patients need to be referred by their primary care doctor.   Medication  Assistance: Organization         Address  Phone   Notes  [facility] Medication [facility] [address] [place], [place] [address] [phone]) [phone] --Must be a resident of [facility] -- Must have NO insurance coverage whatsoever (no Medicaid/ Medicare, etc.) -- The pt. MUST have a primary care doctor that directs their care regularly and follows them in the community   MedAssist  [phone]   [employer]  [phone]) [phone]    Agencies that provide inexpensive medical care: Organization         Address  Phone   Notes  [facility]  [phone]) [phone]   [facility]    [phone]   [facility] [address] [place], [place] [address] [phone]) [phone]   [facility] [address], [place] [phone](336) [phone]   Planned Parenthood    [phone](336) [phone]   [facility]    (  [phone]   [facility]  [address], [place] Phone:  [phone]) [phone], Fax:  [phone] Hours of Operation:  9 am - 6 pm, M-F.  Also accepts Medicaid/Medicare and self-pay.  [facility]  [address], [place] Phone: [phone](336) [phone], Fax: [phone]. Hours of Operation:  8:30 am - 5:30 pm, M-F.  Also accepts Medicaid and self-pay.  [facility] [place] [address], [place] Phone: [phone](336) [phone]   [facility] [address], [place], [place] [phone], Ext. [phone] Mondays & Thursdays: 7-9 AM.  First 15 patients are seen on a first come, first serve basis.    Medicaid-accepting [facility] Providers:  Organization         Address  Phone   Notes  [facility] [address], [place] [phone] Also accepts self-pay patients.  [facility] [address], [place]  [phone]) [phone]   [facility] [address], [place]  [phone](336) [phone]   [facility] [address] [place] [address], [place] [phone]) [phone]   Renaye RakersVeita Bland [address], [place]   [phone] Only accepts [place] Access [place]icaid patients after they have their name applied to their card.   Self-Pay (no insurance) in [facility]:  Organization         Address  Phone   Notes  Sickle Cell Patients, [facility] [address], [place] [phone]) [phone]   [facility] [address], [place] [phone]) [phone]   [facility]  [address], [place] [phone]   [facility]/Dr. Osei-Bonsu  [address] [place] [address], [place] or [phone], [place] [phone] Phone number for both [place] and [place] locations is the same.  [facility] [address], [place] [phone](336) [phone]   [facility] [place] [address] [place] [address], [place] or [address] [phone](336) [phone]) [phone]   [facility] [address], [place] [phone](336) [phone], phone; [phone](336) [phone], fax Sees patients 1st and 3rd Saturday of every month.  Must not qualify for public or private insurance (i.e. Medicaid, Medicare, [place] Health Choice, Veterans' Benefits)  Household income should be no more than 200% of the poverty level The clinic cannot treat you if you are pregnant or think you are pregnant  Sexually transmitted diseases are not treated at the clinic.    Dental Care: Organization         Address  Phone  Notes  [facility] Department of [facility] [address], [place] [phone]) [phone] Accepts children up to age 35 who are enrolled in [place]icaid or [place] Health Choice; pregnant women with a Medicaid card; and children who have applied for Medicaid or [place] Health Choice, but were declined, whose parents can pay a reduced fee at time of service.  [facility]  Department of [facility] [place]  [address], [place] [phone] Accepts children up to age 35 who are enrolled in [place]icaid or [place] Health Choice; pregnant women with a Medicaid card; and children who have applied for Medicaid or [place] Health Choice, but were declined, whose parents can pay a reduced fee at time of service.  [facility]  [address] [place], [place] [phone] Patients are seen by appointment only. Walk-ins are not accepted. [facility] will see patients 15 years of age and older. Monday - Tuesday (8am-5pm) Most Wednesdays (8:30-5pm) $30 per visit, cash only  [facility] Access PROGRAM  [address], [facility] [phone] Patients are seen by appointment only. Walk-ins are not accepted. [facility] will see patients 64 years of age and older. One Wednesday Evening (Monthly: Volunteer Based).  $30 per visit, cash only  [employer]  [phone] for adults; Children under age 75, call Graduate Pediatric Dentistry at [phone]. Children aged 86-14, please call [phone] to request a pediatric application.  Dental services are provided in all areas of dental care including fillings, crowns and bridges, complete and partial dentures, implants, gum treatment, root canals, and extractions. Preventive care is also provided. Treatment is provided to both adults and children. Patients are selected via a lottery and there is often a waiting list.   [facility] [address] [place]  [phone] [email or web address]   [facility] [address] [place], [place] [phone], Ext. [phone] Second and Fourth Thursday of each month, opens at 6:30 AM; Clinic ends at 9 AM.  Patients are seen on a first-come first-served basis, and a limited number are seen during each clinic.   [facility]  [address] Ether [place]ins [place], [place] [phone]    Eligibility Requirements You must have lived in [place], [place], or [place] counties for at least the last three months.   You cannot be eligible for state or federal sponsored [place], including [employer], [place], or [employer].   You generally cannot be eligible for healthcare insurance through your employer.    How to apply: Eligibility screenings are held every Tuesday and Wednesday afternoon from 1:00 pm until 4:00 pm. You do not need an appointment for the interview!  [facility] [address], [place], [place] [phone]   [facility] Health Department  [phone]   [facility] Health Department  [phone]   [facility] Health Department  [phone]    Behavioral Health Resources in the Community: Intensive Outpatient Programs Organization         Address  Phone  Notes  [facility] [address] [place], [place] [phone]   [facility] [address], [place], [place] [phone]   ADS: Alcohol & Drug Svcs [address], [place], [place]  [phone]   [facility] Mental Health [address],  [place], [place] [phone] or [phone]   Substance Abuse Resources Organization         Address  Phone  Notes  Alcohol [facility] Drug Services  [phone]   [facility]  [phone]   [facility] [place]  [phone]   Floydene Flock  [phone]   Residential & Outpatient Substance Abuse Program  [phone]   Psychological Services Organization         Address  Phone  Notes  [facility] Behavioral Health  [phone]   [facility]  [phone]   [facility] Mental Health [address], [place] [phone] or [phone]    Mobile Crisis Teams Organization         Address  Phone  Notes  [facility], Mobile Crisis Care Unit  [phone]   [facility]  [address]  [place],  [place] [phone]   [facility]sch [address] [place] [phone]    Self-Help/Support Groups Organization         Address  Phone             Notes  Mental Health Assoc. of [place] - variety of support groups  336- [phone] Call for more information  Narcotics Anonymous (NA), [facility] [address], [place]  2 meetings at this location   Statisticianesidential Treatment Programs Organization         Address  Phone  Notes  [facility] [address],    [place]  [phone]   [facility]  [address], [place] [phone], [place], [place] [phone]   [facility] [address], [place] [phone] Admissions: 8am-3pm M-F  [facility] [address]    [place], [place] [phone]   [facility] [address], [place], [place] [phone]   [facility] [address]  [place], [place] [phone]   [facility] - Intensive Outpatient [address], [place], [place] [phone]   [facility] ([facility]iction Recovery [facility] [address]  [place], [place] [phone] or [phone]   [facility] (RT[facility]) [address] [place], [place] [phone] Accepts Medicaid  [facility] [address]  [place] [phone] Substance Abuse/Addiction Treatment   [facility] Behavioral Health Resources Organization         Address  Phone  Notes  [facility]  [phone]) [phone]   Angie FavaJulie Brannon, PhD [address], [address] [place], [place]   [phone]) [phone] or [phone](336) [phone]   [facility]   [address] [place], [place] [phone](336) [phone]   [facility] [address], [place], [place] [phone](336) [phone] Insurance/Medicaid/sponsorship through [facility]  Faith and Families [address]                                    [place], [place] [phone](336) [phone] Therapy/tele-psych/case    [facility] [address].   [place], [place] [phone](336) [phone]    Dr. Lolly MustacheArfeen  [phone](336) [phone]   [facility] [place]  [employer] [facility]. 1) [address],  2) [address], [place] 3)  [address], [place] [phone]) [phone]) [phone]  [phone](336) [phone]   [facility] Child Abuse Hotline [phone](336) [phone] or [phone](336) 2493802912 (After Hours)

## 2014-02-19 NOTE — ED Notes (Signed)
Pt c/o lt knee pain after it popping while she was walking 2 wks ago.

## 2014-02-21 NOTE — ED Provider Notes (Signed)
Medical screening examination/treatment/procedure(s) were performed by non-physician practitioner and as supervising physician I was immediately available for consultation/collaboration.   EKG Interpretation None        Coleby Yett E Zayda Angell, MD 02/21/14 1430 

## 2014-08-21 ENCOUNTER — Encounter (HOSPITAL_COMMUNITY): Payer: Self-pay | Admitting: Emergency Medicine

## 2014-09-22 ENCOUNTER — Emergency Department (HOSPITAL_BASED_OUTPATIENT_CLINIC_OR_DEPARTMENT_OTHER)
Admission: EM | Admit: 2014-09-22 | Discharge: 2014-09-22 | Disposition: A | Discharge status: Home / Self Care | Payer: Medicaid Other | Attending: Emergency Medicine | Admitting: Emergency Medicine

## 2014-09-22 ENCOUNTER — Other Ambulatory Visit: Payer: Self-pay

## 2014-09-22 ENCOUNTER — Encounter (HOSPITAL_BASED_OUTPATIENT_CLINIC_OR_DEPARTMENT_OTHER): Payer: Self-pay

## 2014-09-22 ENCOUNTER — Emergency Department (HOSPITAL_BASED_OUTPATIENT_CLINIC_OR_DEPARTMENT_OTHER): Payer: Medicaid Other

## 2014-09-22 DIAGNOSIS — Z720 Tobacco use: Secondary | ICD-10-CM | POA: Insufficient documentation

## 2014-09-22 DIAGNOSIS — R531 Weakness: Secondary | ICD-10-CM | POA: Diagnosis not present

## 2014-09-22 DIAGNOSIS — Z3202 Encounter for pregnancy test, result negative: Secondary | ICD-10-CM | POA: Diagnosis not present

## 2014-09-22 DIAGNOSIS — Z79899 Other long term (current) drug therapy: Secondary | ICD-10-CM | POA: Diagnosis not present

## 2014-09-22 DIAGNOSIS — R200 Anesthesia of skin: Secondary | ICD-10-CM | POA: Insufficient documentation

## 2014-09-22 DIAGNOSIS — R51 Headache: Secondary | ICD-10-CM | POA: Insufficient documentation

## 2014-09-22 DIAGNOSIS — Z791 Long term (current) use of non-steroidal anti-inflammatories (NSAID): Secondary | ICD-10-CM | POA: Diagnosis not present

## 2014-09-22 DIAGNOSIS — R110 Nausea: Secondary | ICD-10-CM | POA: Diagnosis not present

## 2014-09-22 DIAGNOSIS — H539 Unspecified visual disturbance: Secondary | ICD-10-CM | POA: Insufficient documentation

## 2014-09-22 LAB — BASIC METABOLIC PANEL
Anion gap: 13 (ref 5–15)
BUN: 11 mg/dL (ref 6–23)
CHLORIDE: 103 meq/L (ref 96–112)
CO2: 27 meq/L (ref 19–32)
Calcium: 9.7 mg/dL (ref 8.4–10.5)
Creatinine, Ser: 0.8 mg/dL (ref 0.50–1.10)
GFR calc Af Amer: >90 mL/min (ref >90)
GFR calc non Af Amer: >90 mL/min (ref >90)
Glucose, Bld: 87 mg/dL (ref 70–99)
Potassium: 3.7 mEq/L (ref 3.7–5.3)
SODIUM: 143 meq/L (ref 137–147)

## 2014-09-22 LAB — CBC WITH DIFFERENTIAL/PLATELET
Basophils Absolute: 0 10*3/uL (ref 0.0–0.1)
Basophils Relative: 0 % (ref 0–1)
EOS PCT: 1 % (ref 0–5)
Eosinophils Absolute: 0 10*3/uL (ref 0.0–0.7)
HEMATOCRIT: 33.6 % — AB (ref 36.0–46.0)
Hemoglobin: 11.4 g/dL — ABNORMAL LOW (ref 12.0–15.0)
LYMPHS ABS: 2.9 10*3/uL (ref 0.7–4.0)
LYMPHS PCT: 42 % (ref 12–46)
MCH: 32.7 pg (ref 26.0–34.0)
MCHC: 33.9 g/dL (ref 30.0–36.0)
MCV: 96.3 fL (ref 78.0–100.0)
Monocytes Absolute: 0.4 10*3/uL (ref 0.1–1.0)
Monocytes Relative: 5 % (ref 3–12)
NEUTROS ABS: 3.6 10*3/uL (ref 1.7–7.7)
Neutrophils Relative %: 52 % (ref 43–77)
PLATELETS: 208 10*3/uL (ref 150–400)
RBC: 3.49 MIL/uL — AB (ref 3.87–5.11)
RDW: 12.5 % (ref 11.5–15.5)
WBC: 6.9 10*3/uL (ref 4.0–10.5)

## 2014-09-22 LAB — URINALYSIS, ROUTINE W REFLEX MICROSCOPIC
BILIRUBIN URINE: NEGATIVE
Glucose, UA: NEGATIVE mg/dL
HGB URINE DIPSTICK: NEGATIVE
Ketones, ur: NEGATIVE mg/dL
Leukocytes, UA: NEGATIVE
NITRITE: NEGATIVE
PH: 6.5 (ref 5.0–8.0)
Protein, ur: NEGATIVE mg/dL
SPECIFIC GRAVITY, URINE: 1.024 (ref 1.005–1.030)
Urobilinogen, UA: 1 mg/dL (ref 0.0–1.0)

## 2014-09-22 LAB — PREGNANCY, URINE: Preg Test, Ur: NEGATIVE

## 2014-09-22 NOTE — ED Provider Notes (Signed)
CSN: [id number]     Arrival date & time 09/22/14  1817 History   This chart was scribed for Shirley Mulders, MD by Abel Presto, ED Scribe. This patient was seen in room MH04/MH04 and the patient's care was started at 8:05 PM.    Chief Complaint  Patient presents with  . Numbness    The history is provided by the patient. No language interpreter was used.    HPI Comments: Shirley [name] is a 33 y.o. female who presents to the Emergency Department complaining of constant right leg numbness from thigh to bottom of leg with onset 3 weeks ago. Pt notes associated tingling in her toes, slurred speech, occasional right leg weakness, and weakness in both hands, especially in the right and "tunnel vision" with onset 2 weeks ago which has resolved. Pt states she has also noticed some irritation of her eyes.  Pt denies any back injury, cough symptoms, or other symptoms. Pt has no current PCP.   Past Medical History  Diagnosis Date  . Medical history non-contributory    Past Surgical History  Procedure Laterality Date  . Rotator cuff repair    . Breast lumpectomy    . Dilation and curettage of uterus    . Rods in feet    . Laparoscopic tubal ligation Bilateral 08/05/2013    Procedure: LAPAROSCOPIC TUBAL LIGATION;  Surgeon: Antionette Char, MD;  Location: [facility];  Service: Gynecology;  Laterality: Bilateral;   No family history on file. History  Substance Use Topics  . Smoking status: Light Tobacco Smoker    Types: Cigarettes  . Smokeless tobacco: Not on file  . Alcohol Use: No   OB History    Gravida Para Term Preterm AB TAB SAB Ectopic Multiple Living   5 3 3  2  0 1   3     Review of Systems  Constitutional: Negative for fever and chills.  HENT: Negative for rhinorrhea and sore throat.   Eyes: Positive for visual disturbance.  Respiratory: Negative for cough and shortness of breath.   Cardiovascular: Negative for chest pain and leg swelling.  Gastrointestinal: Positive for  nausea. Negative for vomiting, abdominal pain and diarrhea.  Genitourinary: Negative for dysuria.  Musculoskeletal: Negative for back pain and neck pain.  Skin: Negative for rash.  Neurological: Positive for speech difficulty, weakness, numbness and headaches.  Hematological: Does not bruise/bleed easily.  Psychiatric/Behavioral: Negative for confusion.      Allergies  Review of patient's allergies indicates no known allergies.  Home Medications   Prior to Admission medications   Medication Sig Start Date End Date Taking? Authorizing Provider  ibuprofen (ADVIL,MOTRIN) 600 MG tablet Take 1 tablet (600 mg total) by mouth every 6 (six) hours as needed. 02/19/14   Marissa Sciacca, PA-C  oxyCODONE-acetaminophen (PERCOCET) 5-325 MG per tablet Take 2 tablets by mouth every 6 (six) hours as needed for pain. 08/05/13   Antionette Char, MD  Prenatal Vit-Fe Fumarate-FA (PRENATAL MULTIVITAMIN) TABS Take 1 tablet by mouth every morning.    Historical Provider, MD   BP 159/107 mmHg  Pulse 73  Temp(Src) 98 F (36.7 C) (Oral)  Resp 16  Ht 5\' 10"  (1.778 m)  Wt 133 lb (60.328 kg)  BMI 19.08 kg/m2  SpO2 100%  LMP 09/20/2014 Physical Exam  Constitutional: She is oriented to person, place, and time. She appears well-developed and well-nourished.  HENT:  Head: Normocephalic.  Mouth/Throat: Oropharynx is clear and moist.  Eyes: Conjunctivae and EOM are normal. Pupils are equal,  round, and reactive to light. No scleral icterus.  Neck: Normal range of motion. Neck supple.  Cardiovascular: Normal rate, regular rhythm and normal heart sounds.  Exam reveals no friction rub.   No murmur heard. Pulses:      Dorsalis pedis pulses are 1+ on the right side.  Cap refill 1 sec  Pulmonary/Chest: Effort normal and breath sounds normal. No respiratory distress. She has no wheezes. She has no rales.  Abdominal: Soft. There is no tenderness.  Musculoskeletal: Normal range of motion. She exhibits no edema.   Neurological: She is alert and oriented to person, place, and time. No cranial nerve deficit. She exhibits normal muscle tone. Coordination normal.  Pronator drift negative  Skin: Skin is warm and dry.  Psychiatric: She has a normal mood and affect. Her behavior is normal.  Nursing note and vitals reviewed.   ED Course  Procedures (including critical care time) DIAGNOSTIC STUDIES: Oxygen Saturation is 100% on room air, normal by my interpretation.    COORDINATION OF CARE: 8:12 PM Discussed treatment plan with patient at beside, the patient agrees with the plan and has no further questions at this time.   Labs Review Labs Reviewed  URINALYSIS, ROUTINE W REFLEX MICROSCOPIC - Abnormal; Notable for the following:    APPearance CLOUDY (*)    All other components within normal limits  CBC WITH DIFFERENTIAL - Abnormal; Notable for the following:    RBC 3.49 (*)    Hemoglobin 11.4 (*)    HCT 33.6 (*)    All other components within normal limits  PREGNANCY, URINE  BASIC METABOLIC PANEL   Results for orders placed or performed during the hospital encounter of 09/22/14  Urinalysis, Routine w reflex microscopic  Result Value Ref Range   Color, Urine YELLOW YELLOW   APPearance CLOUDY (A) CLEAR   Specific Gravity, Urine 1.024 1.005 - 1.030   pH 6.5 5.0 - 8.0   Glucose, UA NEGATIVE NEGATIVE mg/dL   Hgb urine dipstick NEGATIVE NEGATIVE   Bilirubin Urine NEGATIVE NEGATIVE   Ketones, ur NEGATIVE NEGATIVE mg/dL   Protein, ur NEGATIVE NEGATIVE mg/dL   Urobilinogen, UA 1.0 0.0 - 1.0 mg/dL   Nitrite NEGATIVE NEGATIVE   Leukocytes, UA NEGATIVE NEGATIVE  Pregnancy, urine  Result Value Ref Range   Preg Test, Ur NEGATIVE NEGATIVE  CBC with Differential  Result Value Ref Range   WBC 6.9 4.0 - 10.5 K/uL   RBC 3.49 (L) 3.87 - 5.11 MIL/uL   Hemoglobin 11.4 (L) 12.0 - 15.0 g/dL   HCT 16.133.6 (L) 09.636.0 - 04.546.0 %   MCV 96.3 78.0 - 100.0 fL   MCH 32.7 26.0 - 34.0 pg   MCHC 33.9 30.0 - 36.0 g/dL    RDW 40.912.5 81.111.5 - 91.415.5 %   Platelets 208 150 - 400 K/uL   Neutrophils Relative % 52 43 - 77 %   Neutro Abs 3.6 1.7 - 7.7 K/uL   Lymphocytes Relative 42 12 - 46 %   Lymphs Abs 2.9 0.7 - 4.0 K/uL   Monocytes Relative 5 3 - 12 %   Monocytes Absolute 0.4 0.1 - 1.0 K/uL   Eosinophils Relative 1 0 - 5 %   Eosinophils Absolute 0.0 0.0 - 0.7 K/uL   Basophils Relative 0 0 - 1 %   Basophils Absolute 0.0 0.0 - 0.1 K/uL  Basic metabolic panel  Result Value Ref Range   Sodium 143 137 - 147 mEq/L   Potassium 3.7 3.7 - 5.3 mEq/L  Chloride 103 96 - 112 mEq/L   CO2 27 19 - 32 mEq/L   Glucose, Bld 87 70 - 99 mg/dL   BUN 11 6 - 23 mg/dL   Creatinine, Ser 4.090.80 0.50 - 1.10 mg/dL   Calcium 9.7 8.4 - 81.110.5 mg/dL   GFR calc non Af Amer >90 >90 mL/min   GFR calc Af Amer >90 >90 mL/min   Anion gap 13 5 - 15     Imaging Review Ct Head Wo Contrast  09/22/2014   CLINICAL DATA:  Subacute onset of right-sided leg numbness for 3 weeks. Blurred vision. Initial encounter.  EXAM: CT HEAD WITHOUT CONTRAST  TECHNIQUE: Contiguous axial images were obtained from the base of the skull through the vertex without intravenous contrast.  COMPARISON:  None.  FINDINGS: There is no evidence of acute infarction, mass lesion, or intra- or extra-axial hemorrhage on CT.  The posterior fossa, including the cerebellum, brainstem and fourth ventricle, is within normal limits. The third and lateral ventricles, and basal ganglia are unremarkable in appearance. The cerebral hemispheres are symmetric in appearance, with normal gray-white differentiation. No mass effect or midline shift is seen.  There is no evidence of fracture; visualized osseous structures are unremarkable in appearance. The orbits are within normal limits. The paranasal sinuses and mastoid air cells are well-aerated. No significant soft tissue abnormalities are seen.  IMPRESSION: Unremarkable noncontrast CT of the head.   Electronically Signed   By: Roanna RaiderJeffery  Chang M.D.   On:  09/22/2014 21:23     EKG Interpretation None      MDM   Final diagnoses:  Numbness   Workup for the intermittent neurological symptoms without any significant findings. Referral provided to [facility] for further follow-up. Head CT was negative labs without any significant abnormalities. The numbness to the right leg does not seem to follow sciatic nerve distribution clearly. And also has had symptoms in the upper extremities and symptoms with speech and vision. Possibility of MS is there an MRI of will probably be needed.  I personally performed the services described in this documentation, which was scribed in my presence. The recorded information has been reviewed and is accurate.      Shirley MuldersScott Fayth Trefry, MD 09/22/14 2216

## 2014-09-22 NOTE — Discharge Instructions (Signed)
Workup here tonight was negative. Make an appointment to [facility]. Return for any new or worse symptoms.

## 2014-09-22 NOTE — ED Notes (Signed)
Pt with reported intermittent slurred speech, n/t to RLE, peripheral vision loss bilateral.  Neuro completely intact during triage, no difficulty ambulating or speaking.

## 2014-09-26 ENCOUNTER — Ambulatory Visit: Payer: Medicaid Other | Admitting: Neurology

## 2014-09-28 ENCOUNTER — Encounter: Payer: Self-pay | Admitting: Neurology

## 2014-09-28 ENCOUNTER — Ambulatory Visit (INDEPENDENT_AMBULATORY_CARE_PROVIDER_SITE_OTHER): Payer: Medicaid Other | Admitting: Neurology

## 2014-09-28 VITALS — BP 148/94 | HR 76 | Ht 70.0 in | Wt 133.0 lb

## 2014-09-28 DIAGNOSIS — G43109 Migraine with aura, not intractable, without status migrainosus: Secondary | ICD-10-CM

## 2014-09-28 DIAGNOSIS — G43909 Migraine, unspecified, not intractable, without status migrainosus: Secondary | ICD-10-CM | POA: Insufficient documentation

## 2014-09-28 DIAGNOSIS — M6289 Other specified disorders of muscle: Secondary | ICD-10-CM

## 2014-09-28 DIAGNOSIS — R4781 Slurred speech: Secondary | ICD-10-CM | POA: Insufficient documentation

## 2014-09-28 DIAGNOSIS — R29898 Other symptoms and signs involving the musculoskeletal system: Secondary | ICD-10-CM | POA: Insufficient documentation

## 2014-09-28 NOTE — Progress Notes (Signed)
PATIENT: Shirley [name] DOB: 10/06/1981  HISTORICAL  Adelin Sharol HarnessSimmons 33 yo RH African-American female, referred by emergency room for evaluation of her complains, she does not have primary care physician  Around September 08 2014, she had developed loss of peripheral vision, seeing spots in her visual field, lasting 30 minutes, followed by severe holo-cranial pounding headaches, with associated light noise sensitivity, she slept for couple hours, headache has improved,  [place] [facility] [place], after driving, she noticed right leg numbness tingling, traveling from right groin area, posterior right thigh, right lateral leg, involving right foot, top, and the bottom  Her right leg numbness, mild weakness has been persistent since its onset, she also noticed intermittent right hand numbness, weakness, difficulty opening a door knob,  lasting for 3 days, in early December, also reported episode of slurred speech, blank stare, lasting for few seconds,   She presented to emergency room September 22 2014, CT head was normal  Labs, mild anemia, hemoglobin 11.7, otherwise normal CBC, CMP,  She now continue has right leg numbness, subjective weakness of right hand, right leg, denies visual loss   REVIEW OF SYSTEMS: Full 14 system review of systems performed and notable only for double vision, ringing ears, headaches, numbness, slurred speech weakness  ALLERGIES: No Known Allergies  HOME MEDICATIONS: Current Outpatient Prescriptions on File Prior to Visit  Medication Sig Dispense Refill  . ibuprofen (ADVIL,MOTRIN) 600 MG tablet Take 1 tablet (600 mg total) by mouth every 6 (six) hours as needed. 30 tablet 0  . Prenatal Vit-Fe Fumarate-FA (PRENATAL MULTIVITAMIN) TABS Take 1 tablet by mouth every morning.     No current facility-administered medications on file prior to visit.    PAST MEDICAL HISTORY: Past Medical History  Diagnosis Date  . Medical history non-contributory     . Numbness   . Double vision   . Weakness of hand     PAST SURGICAL HISTORY: Past Surgical History  Procedure Laterality Date  . Rotator cuff repair    . Breast lumpectomy    . Dilation and curettage of uterus    . Rods in feet    . Laparoscopic tubal ligation Bilateral 08/05/2013    Procedure: LAPAROSCOPIC TUBAL LIGATION;  Surgeon: Antionette CharLisa Jackson-Moore, MD;  Location: [facility];  Service: Gynecology;  Laterality: Bilateral;    FAMILY HISTORY: Family History  Problem Relation Age of Onset  . Diabetes Sister   . Multiple sclerosis Sister   . Stroke Mother   . Stroke Father     SOCIAL HISTORY:  History   Social History  . Marital Status: Single    Spouse Name: N/A    Number of Children: 3  . 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 Smoking status: Light Tobacco Smoker    Types: Cigarettes  . Smokeless tobacco: Never Used  . Alcohol Use: No  . Drug Use: No  . Sexual Activity: No   Other Topics Concern  . Not on file   Social History Narrative   Patient lives at home with her three children.    Patient is a homemaker.   Education college education.   Right handed.    Caffeine two cups of herbal tea.     PHYSICAL EXAM   Filed Vitals:   09/28/14 1042  BP: 148/94  Pulse: 76  Height: 5\' 10"  (1.778 m)  Weight: 133 lb (60.328 kg)  Not recorded      Body mass index is 19.08 kg/(m^2).   Generalized: In no acute distress  Neck: Supple, no carotid bruits   Cardiac: Regular rate rhythm  Pulmonary: Clear to auscultation bilaterally  Musculoskeletal: No deformity  Neurological examination  Mentation: Alert oriented to time, place, history taking, and causual conversation  Cranial nerve II-XII: Pupils were equal round reactive to light. Extraocular movements were full.  Visual field were full on confrontational test. Bilateral fundi were sharp.  Facial sensation and strength were normal.  Hearing was intact to finger rubbing bilaterally. Uvula tongue midline.  Head turning and shoulder shrug and were normal and symmetric.Tongue protrusion into cheek strength was normal.  Motor: Normal tone, bulk and strength.  Sensory: Intact to fine touch, pinprick, preserved vibratory sensation, and proprioception at toes.  Coordination: Normal finger to nose, heel-to-shin bilaterally there was no truncal ataxia  Gait: Rising up from seated position without assistance, normal stance, without trunk ataxia, moderate stride, good arm swing, smooth turning, able to perform tiptoe, and heel walking without difficulty.   Romberg signs: Negative  Deep tendon reflexes: Brachioradialis 2/2, biceps 2/2, triceps 2/2, patellar 2/2, Achilles 2/2, plantar responses were flexor bilaterally.   DIAGNOSTIC DATA (LABS, IMAGING, TESTING) - I reviewed patient records, labs, notes, testing and imaging myself where available.  Lab Results  Component Value Date   WBC 6.9 09/22/2014   HGB 11.4* 09/22/2014   HCT 33.6* 09/22/2014   MCV 96.3 09/22/2014   PLT 208 09/22/2014      Component Value Date/Time   NA 143 09/22/2014 2122   K 3.7 09/22/2014 2122   CL 103 09/22/2014 2122   CO2 27 09/22/2014 2122   GLUCOSE 87 09/22/2014 2122   BUN 11 09/22/2014 2122   CREATININE 0.80 09/22/2014 2122   CALCIUM 9.7 09/22/2014 2122   PROT 5.9* 06/12/2013 0830   ALBUMIN 2.5* 06/12/2013 0830   AST 26 06/12/2013 0830   ALT 16 06/12/2013 0830   ALKPHOS 121* 06/12/2013 0830   BILITOT 0.1* 06/12/2013 0830   GFRNONAA >90 09/22/2014 2122   GFRAA >90 09/22/2014 2122    ASSESSMENT AND PLAN  Shirley [name] is a 33 y.o. female complains of new onset right leg numbness, subjective weakness, aso transient right hand, arm numbness weakness, slurred speech,  1, localize the lesion to left frontal lobe, versus right cervical cord 2, complete evaluation with MRI of the brain with and without contrast 3, return to clinic  afterwards    Levert FeinsteinYijun Muaad Boehning, M.D. Ph.D. [facility] [address], [address] [place], [place] [address] [phone]

## 2014-10-04 ENCOUNTER — Telehealth: Payer: Self-pay | Admitting: Neurology

## 2014-10-04 NOTE — Telephone Encounter (Signed)
Shirley [name], please check on her MRI schedules and reschedule her follow up visit, to be after her MRI brain.

## 2014-10-05 NOTE — Telephone Encounter (Signed)
Called and spoke to patient CX. Her apt for 10-10-2014 patient needs to have her MRI I done. Patient  will call me back to schedule . When patient call's back please put patient in Dr.Yan first slot that's open.

## 2014-10-10 ENCOUNTER — Encounter (INDEPENDENT_AMBULATORY_CARE_PROVIDER_SITE_OTHER): Payer: Medicaid Other | Admitting: Diagnostic Neuroimaging

## 2014-10-10 ENCOUNTER — Ambulatory Visit: Payer: Medicaid Other | Admitting: Neurology

## 2014-10-10 ENCOUNTER — Ambulatory Visit
Admission: RE | Admit: 2014-10-10 | Discharge: 2014-10-10 | Disposition: A | Discharge status: Home / Self Care | Payer: Medicaid Other | Source: Ambulatory Visit | Attending: Neurology | Admitting: Neurology

## 2014-10-10 DIAGNOSIS — G43109 Migraine with aura, not intractable, without status migrainosus: Secondary | ICD-10-CM

## 2014-10-10 DIAGNOSIS — R29898 Other symptoms and signs involving the musculoskeletal system: Secondary | ICD-10-CM

## 2014-10-10 DIAGNOSIS — R4781 Slurred speech: Secondary | ICD-10-CM

## 2014-10-10 DIAGNOSIS — M6289 Other specified disorders of muscle: Secondary | ICD-10-CM

## 2014-10-10 MED ORDER — GADOBENATE DIMEGLUMINE 529 MG/ML IV SOLN
13.0000 mL | Freq: Once | INTRAVENOUS | Status: AC | PRN
  Administered 2014-10-10: 13 mL via INTRAVENOUS

## 2014-10-10 NOTE — Telephone Encounter (Signed)
Patient has a follow up with Dr.Yan in January.

## 2014-10-18 NOTE — Progress Notes (Signed)
Quick Note:  Called patient and spoke to her told her normal MRI . Patient will keep her follow up. ______

## 2014-10-24 ENCOUNTER — Ambulatory Visit (INDEPENDENT_AMBULATORY_CARE_PROVIDER_SITE_OTHER): Payer: Medicaid Other | Admitting: Neurology

## 2014-10-24 ENCOUNTER — Encounter: Payer: Self-pay | Admitting: Neurology

## 2014-10-24 ENCOUNTER — Ambulatory Visit: Payer: Medicaid Other | Admitting: Neurology

## 2014-10-24 VITALS — BP 148/101 | HR 79 | Ht 70.0 in | Wt 133.0 lb

## 2014-10-24 DIAGNOSIS — R4781 Slurred speech: Secondary | ICD-10-CM

## 2014-10-24 DIAGNOSIS — M6289 Other specified disorders of muscle: Secondary | ICD-10-CM

## 2014-10-24 DIAGNOSIS — R29898 Other symptoms and signs involving the musculoskeletal system: Secondary | ICD-10-CM

## 2014-10-24 DIAGNOSIS — G43109 Migraine with aura, not intractable, without status migrainosus: Secondary | ICD-10-CM

## 2014-10-24 MED ORDER — SUMATRIPTAN SUCCINATE 50 MG PO TABS: 50.0000 mg | ORAL_TABLET | ORAL | Status: DC | PRN

## 2014-10-24 MED ORDER — GABAPENTIN 300 MG PO CAPS: 300.0000 mg | ORAL_CAPSULE | Freq: Three times a day (TID) | ORAL | Status: DC

## 2014-10-24 NOTE — Progress Notes (Signed)
PATIENT: Shirley [name] DOB: 12/03/1980  HISTORICAL  Shirley [name] 34 yo RH African-American female, referred by emergency room for evaluation of her complains, she does not have primary care physician  Around September 08 2014, she had developed loss of peripheral vision, seeing spots in her visual field, lasting 30 minutes, followed by severe holo-cranial pounding headaches, with associated light noise sensitivity, she slept for couple hours, headache has improved,  [place] [facility] [place], after driving, she noticed right leg numbness tingling, traveling from right groin area, posterior right thigh, right lateral leg, involving right foot, top, and the bottom  Her right leg numbness, mild weakness has been persistent since its onset, she also noticed intermittent right hand numbness, weakness, difficulty opening a door knob,  lasting for 3 days, in early December, also reported episode of slurred speech, blank stare, lasting for few seconds,   She presented to emergency room September 22 2014, CT head was normal  Labs, mild anemia, hemoglobin 11.7, otherwise normal CBC, CMP,  She now continue has right leg numbness, subjective weakness of right hand, right leg, denies visual loss   UPDATE Jan 5th 2016: MRI of the brain was normal  She has worsening right leg numbness, also has more right arm numbness extending from right elbow to her right fingers, she denies significant weakness She has frequent headaches daily mild to moderate pressure holoacranial headaches, tinnitus of bilateral ear,  REVIEW OF SYSTEMS: Full 14 system review of systems performed and notable only for double vision, ringing ears, headaches, numbness, slurred speech weakness  ALLERGIES: No Known Allergies  HOME MEDICATIONS: Current Outpatient Prescriptions on File Prior to Visit  Medication Sig Dispense Refill  . ibuprofen (ADVIL,MOTRIN) 600 MG tablet Take 1 tablet (600 mg total) by mouth  every 6 (six) hours as needed. 30 tablet 0  . Prenatal Vit-Fe Fumarate-FA (PRENATAL MULTIVITAMIN) TABS Take 1 tablet by mouth every morning.     No current facility-administered medications on file prior to visit.    PAST MEDICAL HISTORY: Past Medical History  Diagnosis Date  . Medical history non-contributory   . Numbness   . Double vision   . Weakness of hand     PAST SURGICAL HISTORY: Past Surgical History  Procedure Laterality Date  . Rotator cuff repair    . Breast lumpectomy    . Dilation and curettage of uterus    . Rods in feet    . Laparoscopic tubal ligation Bilateral 08/05/2013    Procedure: LAPAROSCOPIC TUBAL LIGATION;  Surgeon: Antionette CharLisa Jackson-Moore, MD;  Location: [facility];  Service: Gynecology;  Laterality: Bilateral;    FAMILY HISTORY: Family History  Problem Relation Age of Onset  . Diabetes Sister   . Multiple sclerosis Sister   . Stroke Mother   . Stroke Father     SOCIAL HISTORY:  History   Social History  . Marital Status: Single    Spouse Name: N/A    Number of Children: 3  . 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 Smoking status: Light Tobacco Smoker    Types: Cigarettes  . Smokeless tobacco: Never Used  . Alcohol Use: No  . Drug Use: No  . Sexual Activity: No   Other Topics Concern  . Not on file   Social History Narrative   Patient lives at home with her three children.    Patient is a homemaker.  Education college education.   Right handed.    Caffeine two cups of herbal tea.     PHYSICAL EXAM   Filed Vitals:   10/24/14 1521  Weight: 133 lb (60.328 kg)    Not recorded      Body mass index is 19.08 kg/(m^2).   Generalized: In no acute distress  Neck: Supple, no carotid bruits   Cardiac: Regular rate rhythm  Pulmonary: Clear to auscultation bilaterally  Musculoskeletal: No deformity  Neurological examination  Mentation: Alert oriented to time,  place, history taking, and causual conversation  Cranial nerve II-XII: Pupils were equal round reactive to light. Extraocular movements were full.  Visual field were full on confrontational test. Bilateral fundi were sharp.  Facial sensation and strength were normal. Hearing was intact to finger rubbing bilaterally. Uvula tongue midline.  Head turning and shoulder shrug and were normal and symmetric.Tongue protrusion into cheek strength was normal.  Motor: Normal tone, bulk and strength.  Sensory: Intact to fine touch, pinprick, preserved vibratory sensation, and proprioception at toes.  Coordination: Normal finger to nose, heel-to-shin bilaterally there was no truncal ataxia  Gait: Rising up from seated position without assistance, normal stance, without trunk ataxia, moderate stride, good arm swing, smooth turning, able to perform tiptoe, and heel walking without difficulty.   Romberg signs: Negative  Deep tendon reflexes: Brachioradialis 2/2, biceps 2/2, triceps 2/2, patellar 2/2, Achilles 2/2, plantar responses were flexor bilaterally.   DIAGNOSTIC DATA (LABS, IMAGING, TESTING) - I reviewed patient records, labs, notes, testing and imaging myself where available.  Lab Results  Component Value Date   WBC 6.9 09/22/2014   HGB 11.4* 09/22/2014   HCT 33.6* 09/22/2014   MCV 96.3 09/22/2014   PLT 208 09/22/2014      Component Value Date/Time   NA 143 09/22/2014 2122   K 3.7 09/22/2014 2122   CL 103 09/22/2014 2122   CO2 27 09/22/2014 2122   GLUCOSE 87 09/22/2014 2122   BUN 11 09/22/2014 2122   CREATININE 0.80 09/22/2014 2122   CALCIUM 9.7 09/22/2014 2122   PROT 5.9* 06/12/2013 0830   ALBUMIN 2.5* 06/12/2013 0830   AST 26 06/12/2013 0830   ALT 16 06/12/2013 0830   ALKPHOS 121* 06/12/2013 0830   BILITOT 0.1* 06/12/2013 0830   GFRNONAA >90 09/22/2014 2122   GFRAA >90 09/22/2014 2122    ASSESSMENT AND PLAN  Marsela Kuan is a 34 y.o. female complains of new onset right leg  numbness, subjective weakness, aso transient right hand, arm numbness weakness, slurred speech, MRI of the brain was normal   1, localize the lesion right cervical cord 2, complete evaluation with MRI of the cervical with and without contrast 3, return to clinic in 3-4 weeks 4. Neurontin 300 mg 3 times a day for her frequent headaches, and right side paresthesia    Levert Feinstein, M.D. Ph.D. [facility] [address] [place], [place] [address] [phone]

## 2014-10-30 ENCOUNTER — Ambulatory Visit
Admission: RE | Admit: 2014-10-30 | Discharge: 2014-10-30 | Disposition: A | Discharge status: Home / Self Care | Payer: Medicaid Other | Source: Ambulatory Visit | Attending: Neurology | Admitting: Neurology

## 2014-10-30 ENCOUNTER — Encounter (INDEPENDENT_AMBULATORY_CARE_PROVIDER_SITE_OTHER): Payer: Medicaid Other | Admitting: Diagnostic Neuroimaging

## 2014-10-30 DIAGNOSIS — G43109 Migraine with aura, not intractable, without status migrainosus: Secondary | ICD-10-CM

## 2014-10-30 DIAGNOSIS — R29898 Other symptoms and signs involving the musculoskeletal system: Secondary | ICD-10-CM

## 2014-10-30 DIAGNOSIS — M6289 Other specified disorders of muscle: Secondary | ICD-10-CM

## 2014-10-30 DIAGNOSIS — R4781 Slurred speech: Secondary | ICD-10-CM

## 2014-10-30 MED ORDER — GADOBENATE DIMEGLUMINE 529 MG/ML IV SOLN
12.0000 mL | Freq: Once | INTRAVENOUS | Status: AC | PRN
  Administered 2014-10-30: 12 mL via INTRAVENOUS

## 2014-11-02 ENCOUNTER — Telehealth: Payer: Self-pay | Admitting: Neurology

## 2014-11-02 NOTE — Telephone Encounter (Signed)
Shirley [name]/Shirley [name]: Please call patient MRI of the cervical showed no significant abnormality, no change in treatment plan, keep follow-up appointment in February  Equivocal MRI cervical spine (with and without) demonstrating: 1. Mild disc bulging at C2-3 and C3-4. No spinal stenosis or foraminal narrowing.  2. No intrinsic or enhancing spinal cord lesions.

## 2014-11-03 NOTE — Telephone Encounter (Signed)
Spoke to [place]vale[place]ony - review results - she verbalized understanding.  She will keep follow up appointment in February. Madison Hickman/mck

## 2014-11-22 ENCOUNTER — Telehealth: Payer: Self-pay | Admitting: Neurology

## 2014-11-22 NOTE — Telephone Encounter (Signed)
I have contacted ins who says this drug is formulary, and no prior Berkley Harveyauth is required.  I called the pharmacy.  They processed the claim, and it paid with no problems.  They will fill this Rx today.  I called the patient back.  Got no answer.  Mailbox was full.  Unable to leave message.

## 2014-11-22 NOTE — Telephone Encounter (Signed)
Pt is calling stating the pharmacy needs prior auth on SUMAtriptan (IMITREX) 50 MG tablet to refill.  Please call and advise

## 2014-11-30 ENCOUNTER — Ambulatory Visit: Payer: Medicaid Other | Admitting: Neurology

## 2014-12-21 ENCOUNTER — Telehealth: Payer: Self-pay | Admitting: *Deleted

## 2014-12-21 ENCOUNTER — Ambulatory Visit: Payer: Medicaid Other | Admitting: Neurology

## 2014-12-21 NOTE — Telephone Encounter (Signed)
No showed appt today. 

## 2014-12-22 ENCOUNTER — Encounter: Payer: Self-pay | Admitting: Neurology

## 2015-08-31 IMAGING — CT CT HEAD W/O CM
1 series · 16 of 30 positions shown, 20 images · non-contrast
Comparison: None.

CLINICAL DATA: Subacute onset of right-sided leg numbness for 3
weeks. Blurred vision. Initial encounter.

EXAM:
CT HEAD WITHOUT CONTRAST
TECHNIQUE: Contiguous axial images were obtained from the base of the skull
through the vertex without intravenous contrast.

[Series 2: head 4.8 h37s · axial · 0.41mm/px · z∈[+1010,+1166]mm · 16 of 36 slices shown, 20 images]
[im 2/36  brain]
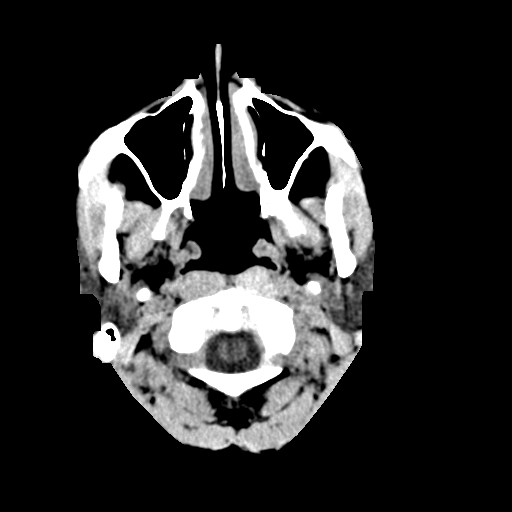
[im 2/36  bone]
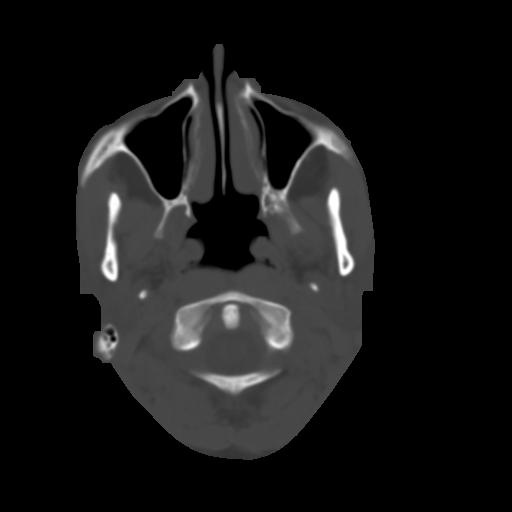
[im 4/36  brain]
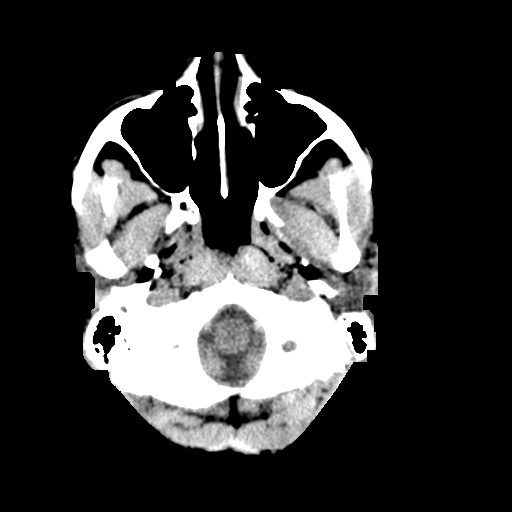
[im 7/36  brain]
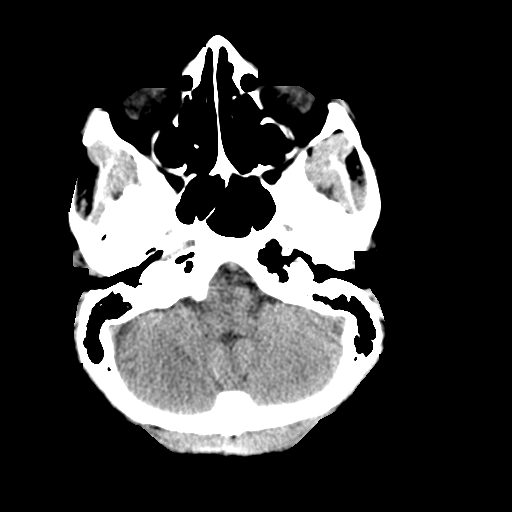
[im 9/36  brain]
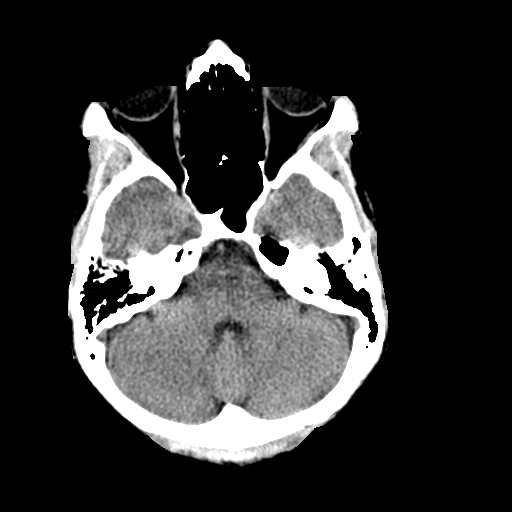
[im 10/36  brain]
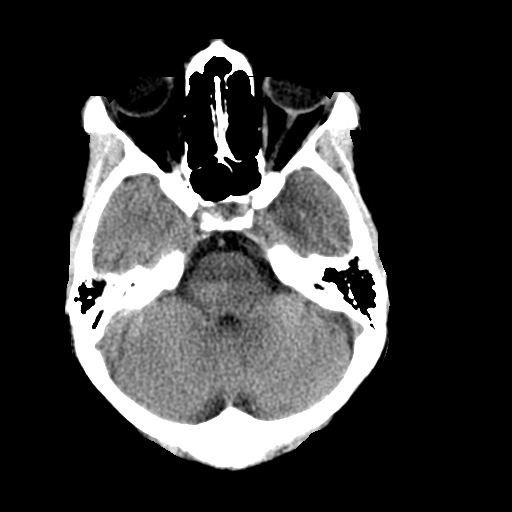
[im 10/36  bone]
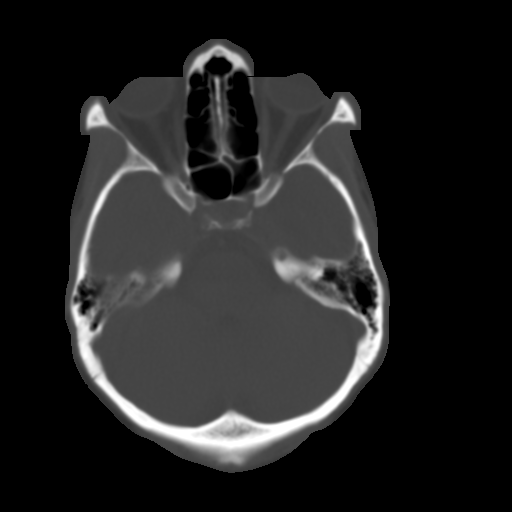
[im 13/36  brain]
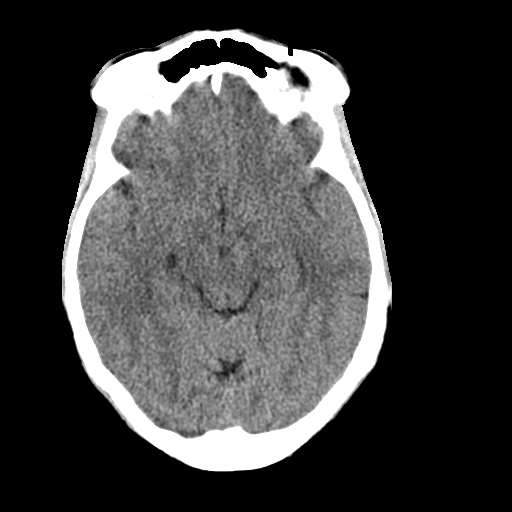
[im 15/36  brain]
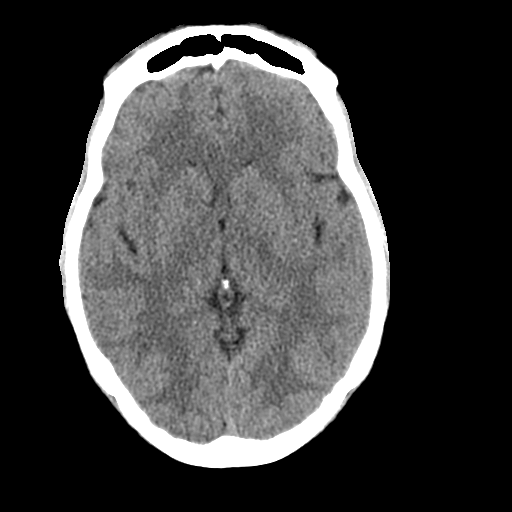
[im 17/36  brain]
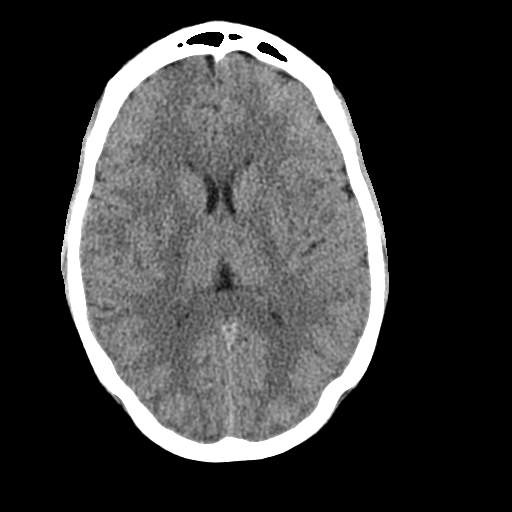
[im 19/36  brain]
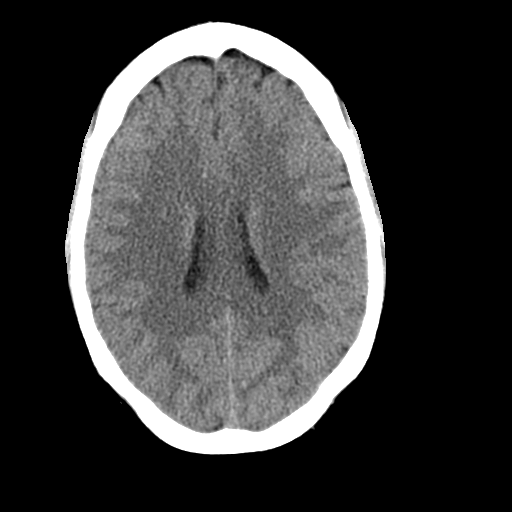
[im 19/36  bone]
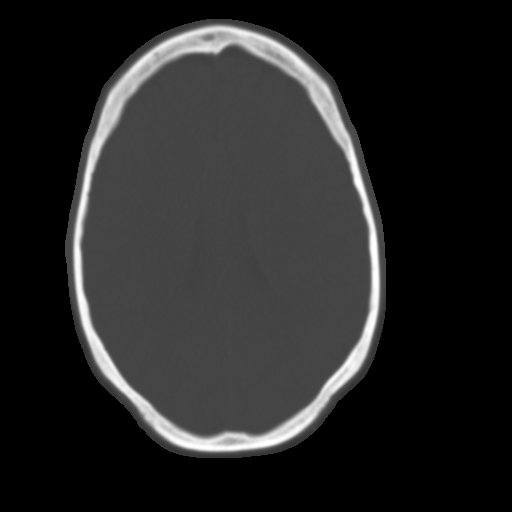
[im 21/36  brain]
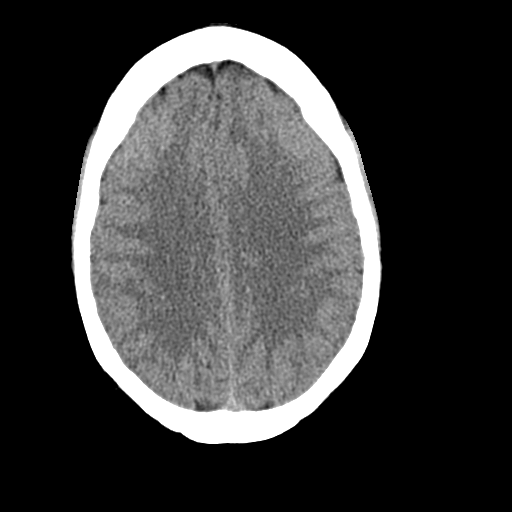
[im 23/36  brain]
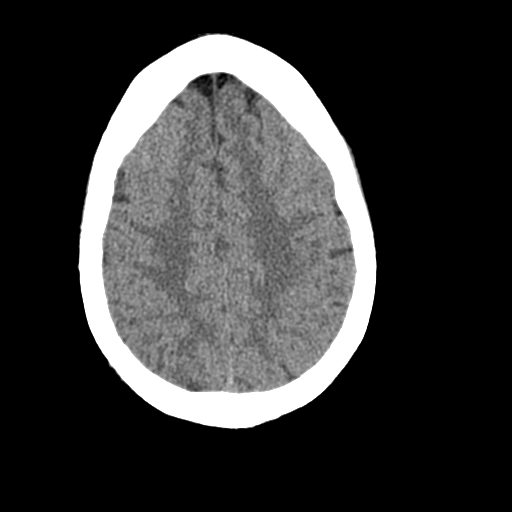
[im 26/36  brain]
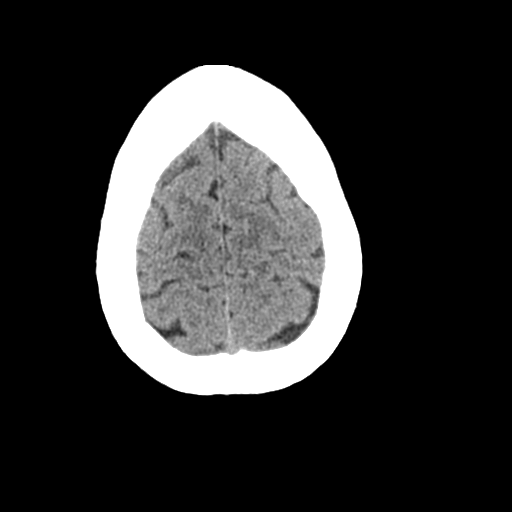
[im 27/36  brain]
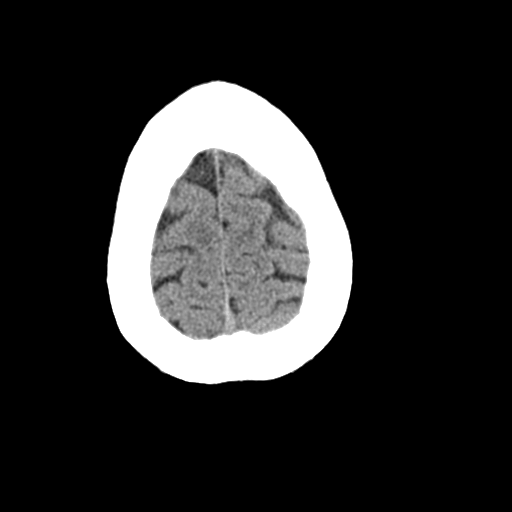
[im 27/36  bone]
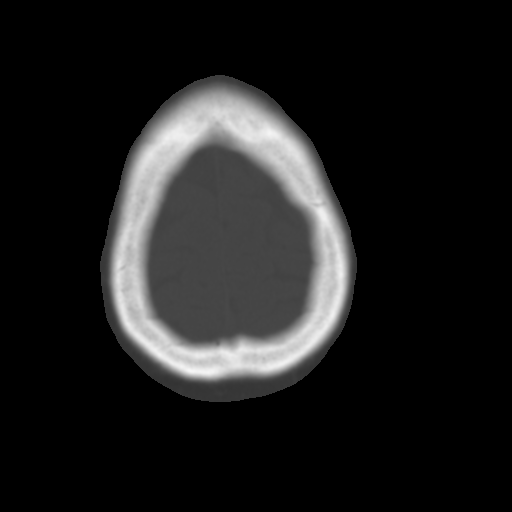
[im 29/36  brain]
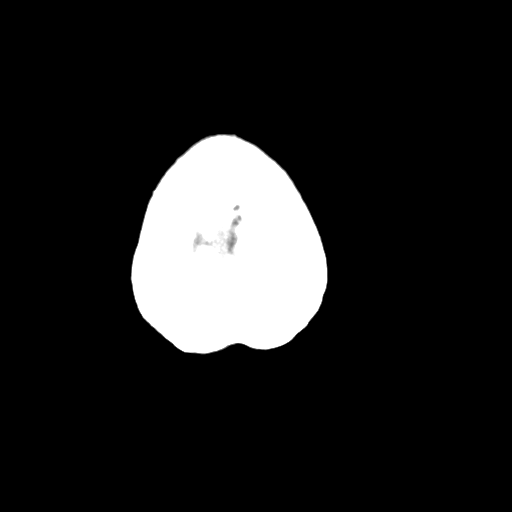
[im 32/36  brain]
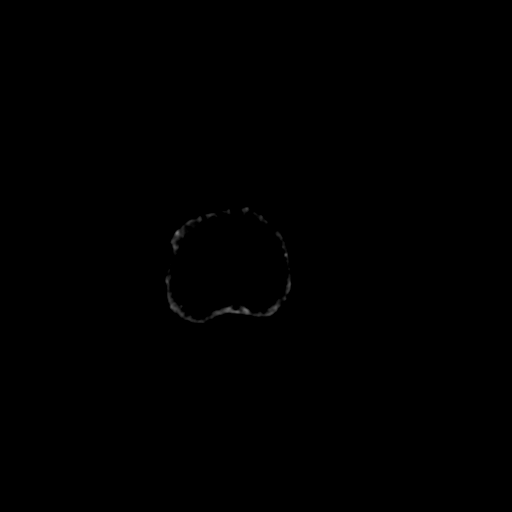
[im 34/36  brain]
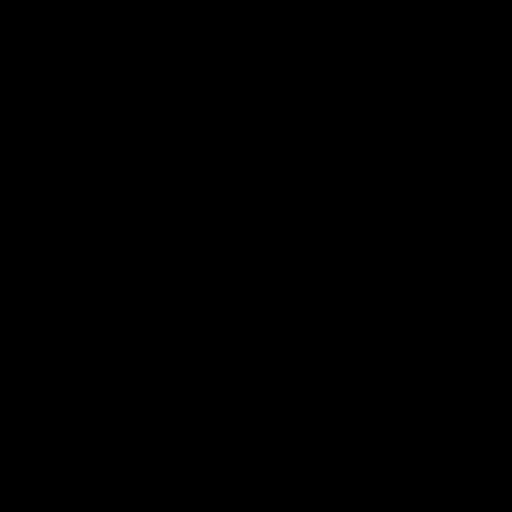

[16 of 30 positions shown; findings below may reference images not displayed]

FINDINGS: There is no evidence of acute infarction, mass lesion, or intra- or
extra-axial hemorrhage on CT.

The posterior fossa, including the cerebellum, brainstem and fourth
ventricle, is within normal limits. The third and lateral
ventricles, and basal ganglia are unremarkable in appearance. The
cerebral hemispheres are symmetric in appearance, with normal
gray-white differentiation. No mass effect or midline shift is seen.

There is no evidence of fracture; visualized osseous structures are
unremarkable in appearance. The orbits are within normal limits. The
paranasal sinuses and mastoid air cells are well-aerated. No
significant soft tissue abnormalities are seen.
IMPRESSION: Unremarkable noncontrast CT of the head.

## 2016-07-09 ENCOUNTER — Ambulatory Visit (INDEPENDENT_AMBULATORY_CARE_PROVIDER_SITE_OTHER): Payer: Medicaid Other | Admitting: Obstetrics

## 2016-07-09 ENCOUNTER — Encounter: Payer: Self-pay | Admitting: Obstetrics

## 2016-07-09 VITALS — BP 167/95 | HR 76 | Temp 98.5°F | Ht 70.0 in | Wt 142.4 lb

## 2016-07-09 DIAGNOSIS — Z0000 Encounter for general adult medical examination without abnormal findings: Secondary | ICD-10-CM

## 2016-07-09 DIAGNOSIS — N939 Abnormal uterine and vaginal bleeding, unspecified: Secondary | ICD-10-CM

## 2016-07-09 DIAGNOSIS — N946 Dysmenorrhea, unspecified: Secondary | ICD-10-CM

## 2016-07-09 DIAGNOSIS — Z124 Encounter for screening for malignant neoplasm of cervix: Secondary | ICD-10-CM

## 2016-07-09 DIAGNOSIS — Z01419 Encounter for gynecological examination (general) (routine) without abnormal findings: Secondary | ICD-10-CM | POA: Diagnosis not present

## 2016-07-09 DIAGNOSIS — N943 Premenstrual tension syndrome: Secondary | ICD-10-CM

## 2016-07-09 MED ORDER — HYDROMORPHONE HCL 4 MG PO TABS: 4.0000 mg | ORAL_TABLET | Freq: Three times a day (TID) | ORAL | 0 refills | Status: DC | PRN

## 2016-07-09 MED ORDER — PAROXETINE HCL 20 MG PO TABS: 20.0000 mg | ORAL_TABLET | Freq: Every day | ORAL | 11 refills | Status: DC

## 2016-07-09 MED ORDER — IBUPROFEN 800 MG PO TABS: 800.0000 mg | ORAL_TABLET | Freq: Three times a day (TID) | ORAL | 5 refills | Status: DC | PRN

## 2016-07-09 NOTE — Addendum Note (Signed)
Addended by: Elby BeckPAUL, Richell Corker F on: 07/09/2016 10:46 AM   Modules accepted: Orders

## 2016-07-09 NOTE — Progress Notes (Signed)
Subjective:        Shirley [name] is a 35 y.o. female here for a routine exam.  Current complaints: Heavy and painful periods since tubal sterilization.  Very irritable during period time.   Personal health questionnaire:  Is patient Ashkenazi Jewish, have a family history of breast and/or ovarian cancer: no Is there a family history of uterine cancer diagnosed at age < 46, gastrointestinal cancer, urinary tract cancer, family member who is a Personnel officer syndrome-associated carrier: no Is the patient overweight and hypertensive, family history of diabetes, personal history of gestational diabetes, preeclampsia or PCOS: no Is patient over 55, have PCOS,  family history of premature CHD under age 42, diabetes, smoke, have hypertension or peripheral artery disease:  no At any time, has a partner hit, kicked or otherwise hurt or frightened you?: no Over the past 2 weeks, have you felt down, depressed or hopeless?: no Over the past 2 weeks, have you felt little interest or pleasure in doing things?:no   Gynecologic History Patient's last menstrual period was 06/24/2016 (exact date). Contraception: tubal ligation Last Pap: unknown. Results were: unknown Last mammogram: n/a. Results were: n/a  Obstetric History OB History  Gravida Para Term Preterm AB Living  5 3 3   2 3   SAB TAB Ectopic Multiple Live Births  1 0     3    # Outcome Date GA Lbr Len/2nd Weight Sex Delivery Anes PTL Lv  5 Term 06/12/13 [redacted]w[redacted]d 04:05 / 00:22 8 lb 4.8 oz (3.765 kg) F Vag-Spont EPI  LIV  4 Term 08/10/08 [redacted]w[redacted]d  7 lb 10 oz (3.459 kg) M Vag-Spont EPI  LIV  3 SAB 12/07/06          2 AB 12/22/05          1 Term 09/27/97 [redacted]w[redacted]d  7 lb 11 oz (3.487 kg) F Vag-Spont EPI  LIV      Past Medical History:  Diagnosis Date  . Double vision   . Medical history non-contributory   . Numbness   . Weakness of hand     Past Surgical History:  Procedure Laterality Date  . BREAST LUMPECTOMY    . DILATION AND CURETTAGE OF  UTERUS    . LAPAROSCOPIC TUBAL LIGATION Bilateral 08/05/2013   Procedure: LAPAROSCOPIC TUBAL LIGATION;  Surgeon: Antionette Char, MD;  Location: [facility];  Service: Gynecology;  Laterality: Bilateral;  . rods in feet    . ROTATOR CUFF REPAIR       Current Outpatient Prescriptions:  .  gabapentin (NEURONTIN) 300 MG capsule, Take 1 capsule (300 mg total) by mouth 3 (three) times daily. (Patient not taking: Reported on 07/09/2016), Disp: 90 capsule, Rfl: 6 .  ibuprofen (ADVIL,MOTRIN) 600 MG tablet, Take 1 tablet (600 mg total) by mouth every 6 (six) hours as needed. (Patient not taking: Reported on 07/09/2016), Disp: 30 tablet, Rfl: 0 .  Prenatal Vit-Fe Fumarate-FA (PRENATAL MULTIVITAMIN) TABS, Take 1 tablet by mouth every morning., Disp: , Rfl:  .  SUMAtriptan (IMITREX) 50 MG tablet, Take 1 tablet (50 mg total) by mouth every 2 (two) hours as needed for migraine or headache. May repeat in 2 hours if headache persists or recurs. (Patient not taking: Reported on 07/09/2016), Disp: 15 tablet, Rfl: 6 No Known Allergies  Social History  Substance Use Topics  . Smoking status: Light Tobacco Smoker    Packs/day: 0.25    Types: Cigarettes  . Smokeless tobacco: Never Used  . Alcohol use No  Family History  Problem Relation Age of Onset  . Stroke Mother   . Stroke Father   . Diabetes Sister   . Multiple sclerosis Sister       Review of Systems  Constitutional: negative for fatigue and weight loss Respiratory: negative for cough and wheezing Cardiovascular: negative for chest pain, fatigue and palpitations Gastrointestinal: negative for abdominal pain and change in bowel habits Musculoskeletal:negative for myalgias Neurological: negative for gait problems and tremors Behavioral/Psych: negative for abusive relationship, depression Endocrine: negative for temperature intolerance   Genitourinary:positive for abnormal menstrual periods Integument/breast: negative for breast lump, breast  tenderness, nipple discharge and skin lesion(s)    Objective:       BP (!) 167/95   Pulse 76   Temp 98.5 F (36.9 C)   Ht 5\' 10"  (1.778 m)   Wt 142 lb 6.4 oz (64.6 kg)   LMP 06/24/2016 (Exact Date)   BMI 20.43 kg/m  General:   alert  Skin:   no rash or abnormalities  Lungs:   clear to auscultation bilaterally  Heart:   regular rate and rhythm, S1, S2 normal, no murmur, click, rub or gallop  Breasts:   normal without suspicious masses, skin or nipple changes or axillary nodes  Abdomen:  normal findings: no organomegaly, soft, non-tender and no hernia  Pelvis:  External genitalia: normal general appearance Urinary system: urethral meatus normal and bladder without fullness, nontender Vaginal: normal without tenderness, induration or masses.  Copious discharge, thick, white Cervix: normal appearance Adnexa: normal bimanual exam Uterus: anteverted and non-tender, normal size   Lab Review Urine pregnancy test Labs reviewed yes Radiologic studies reviewed yes  50% of 20 min visit spent on counseling and coordination of care.   Assessment:    Healthy female exam.    Severe Dysmenorrhea  AUB  PMS    Plan:    Ibuprofen / Dilaudid Rx for dysmenorrhea  Ultrasound ordered to R/O fibroids / endometrial polyps  Paxil Rx for PMS along with educational material on PMS   Education reviewed: calcium supplements, depression evaluation, safe sex/STD prevention, self breast exams, smoking cessation and weight bearing exercise. Contraception: tubal ligation. Follow up in: 2 weeks.   No orders of the defined types were placed in this encounter.  No orders of the defined types were placed in this encounter.     Patient ID: Costella Hatcherbony Bores, female   DOB: 05/22/1981, 35 y.o.   MRN: [id number]

## 2016-07-12 LAB — NUSWAB VAGINITIS PLUS (VG+)
CANDIDA GLABRATA, NAA: NEGATIVE
Candida albicans, NAA: POSITIVE — AB
Chlamydia trachomatis, NAA: NEGATIVE
Neisseria gonorrhoeae, NAA: NEGATIVE
Trich vag by NAA: NEGATIVE

## 2016-07-14 ENCOUNTER — Other Ambulatory Visit: Payer: Self-pay | Admitting: Obstetrics

## 2016-07-14 DIAGNOSIS — B9689 Other specified bacterial agents as the cause of diseases classified elsewhere: Secondary | ICD-10-CM

## 2016-07-14 DIAGNOSIS — N760 Acute vaginitis: Principal | ICD-10-CM

## 2016-07-14 DIAGNOSIS — B3731 Acute candidiasis of vulva and vagina: Secondary | ICD-10-CM

## 2016-07-14 DIAGNOSIS — B373 Candidiasis of vulva and vagina: Secondary | ICD-10-CM

## 2016-07-14 MED ORDER — METRONIDAZOLE 500 MG PO TABS: 500.0000 mg | ORAL_TABLET | Freq: Two times a day (BID) | ORAL | 2 refills | Status: DC

## 2016-07-14 MED ORDER — FLUCONAZOLE 150 MG PO TABS: 150.0000 mg | ORAL_TABLET | Freq: Once | ORAL | 2 refills | Status: AC

## 2016-07-15 LAB — PAP IG AND HPV HIGH-RISK
HPV, high-risk: NEGATIVE
PAP Smear Comment: 0

## 2016-07-24 ENCOUNTER — Ambulatory Visit (INDEPENDENT_AMBULATORY_CARE_PROVIDER_SITE_OTHER): Payer: Medicaid Other

## 2016-07-24 ENCOUNTER — Ambulatory Visit: Payer: Self-pay | Admitting: Obstetrics

## 2016-07-24 DIAGNOSIS — N939 Abnormal uterine and vaginal bleeding, unspecified: Secondary | ICD-10-CM | POA: Diagnosis not present

## 2016-11-07 ENCOUNTER — Emergency Department (HOSPITAL_BASED_OUTPATIENT_CLINIC_OR_DEPARTMENT_OTHER): Payer: Medicaid Other

## 2016-11-07 ENCOUNTER — Encounter (HOSPITAL_BASED_OUTPATIENT_CLINIC_OR_DEPARTMENT_OTHER): Payer: Self-pay

## 2016-11-07 ENCOUNTER — Emergency Department (HOSPITAL_BASED_OUTPATIENT_CLINIC_OR_DEPARTMENT_OTHER)
Admission: EM | Admit: 2016-11-07 | Discharge: 2016-11-07 | Disposition: A | Discharge status: Home / Self Care | Payer: Medicaid Other | Attending: Emergency Medicine | Admitting: Emergency Medicine

## 2016-11-07 DIAGNOSIS — R1031 Right lower quadrant pain: Secondary | ICD-10-CM | POA: Diagnosis present

## 2016-11-07 DIAGNOSIS — N839 Noninflammatory disorder of ovary, fallopian tube and broad ligament, unspecified: Secondary | ICD-10-CM | POA: Diagnosis not present

## 2016-11-07 DIAGNOSIS — F17210 Nicotine dependence, cigarettes, uncomplicated: Secondary | ICD-10-CM | POA: Insufficient documentation

## 2016-11-07 DIAGNOSIS — B3731 Acute candidiasis of vulva and vagina: Secondary | ICD-10-CM

## 2016-11-07 DIAGNOSIS — Z79899 Other long term (current) drug therapy: Secondary | ICD-10-CM | POA: Diagnosis not present

## 2016-11-07 DIAGNOSIS — R102 Pelvic and perineal pain: Secondary | ICD-10-CM

## 2016-11-07 DIAGNOSIS — B373 Candidiasis of vulva and vagina: Secondary | ICD-10-CM | POA: Insufficient documentation

## 2016-11-07 DIAGNOSIS — N838 Other noninflammatory disorders of ovary, fallopian tube and broad ligament: Secondary | ICD-10-CM

## 2016-11-07 LAB — URINALYSIS, ROUTINE W REFLEX MICROSCOPIC
Bilirubin Urine: NEGATIVE
Glucose, UA: NEGATIVE mg/dL
Ketones, ur: NEGATIVE mg/dL
NITRITE: NEGATIVE
Protein, ur: NEGATIVE mg/dL
Specific Gravity, Urine: 1.003 — ABNORMAL LOW (ref 1.005–1.030)
pH: 7 (ref 5.0–8.0)

## 2016-11-07 LAB — WET PREP, GENITAL
Clue Cells Wet Prep HPF POC: NONE SEEN
SPERM: NONE SEEN
Trich, Wet Prep: NONE SEEN

## 2016-11-07 LAB — URINALYSIS, MICROSCOPIC (REFLEX)

## 2016-11-07 LAB — PREGNANCY, URINE: Preg Test, Ur: NEGATIVE

## 2016-11-07 MED ORDER — FLUCONAZOLE 100 MG PO TABS
200.0000 mg | ORAL_TABLET | Freq: Once | ORAL | Status: AC
  Administered 2016-11-07: 200 mg via ORAL
  Filled 2016-11-07: qty 2

## 2016-11-07 NOTE — ED Provider Notes (Signed)
[facility] Provider Note   CSN: [id number] Arrival date & time: 11/07/16  1545   By signing my name below, I, Nelwyn Salisbury, attest that this documentation has been prepared under the direction and in the presence of Lavera Guise, MD . Electronically Signed: Nelwyn Salisbury, Scribe. 11/07/2016. 6:25 PM.  History   Chief Complaint Chief Complaint  Patient presents with  . Abdominal Pain   The history is provided by the patient. No language interpreter was used.    HPI Comments:  Shirley [name] is a 36 y.o. female with pshx of tubal ligation who presents to the Emergency Department complaining of sudden-onset, intermittent, unchanged RLQ abdominal pain beginning 2 days ago. Pt describes her symptoms as a sharp pain that radiates around from her RLQ to her right lower back. She notes that her LNMP was 10/09/16 and she was due to start her period today, however she has only noticed minimal spotting. Pt reports associated nausea. She has tried 800mg  of ibuprofen at home with some relief of her abdominal symptoms, but no relief for her back pain. Pt denies any dysuria, hematuria, vomiting, diarrhea or vaginal discharge.    Past Medical History:  Diagnosis Date  . Double vision   . Medical history non-contributory   . Numbness   . Weakness of hand     Patient Active Problem List   Diagnosis Date Noted  . Slurred speech 09/28/2014  . Migraine 09/28/2014  . Weakness of hand   . Unspecified symptom associated with female genital organs 03/07/2013  . Allergic rhinitis, seasonal 03/07/2013  . GERD (gastroesophageal reflux disease) 03/07/2013    Past Surgical History:  Procedure Laterality Date  . BREAST LUMPECTOMY    . DILATION AND CURETTAGE OF UTERUS    . LAPAROSCOPIC TUBAL LIGATION Bilateral 08/05/2013   Procedure: LAPAROSCOPIC TUBAL LIGATION;  Surgeon: Antionette Char, MD;  Location: [facility];  Service: Gynecology;  Laterality: Bilateral;  . rods in feet    . ROTATOR  CUFF REPAIR      OB History    Gravida Para Term Preterm AB Living   5 3 3   2 3    SAB TAB Ectopic Multiple Live Births   1 0     3       Home Medications    Prior to Admission medications   Medication Sig Start Date End Date Taking? Authorizing Provider  Prenatal Vit-Fe Fumarate-FA (PRENATAL MULTIVITAMIN) TABS Take 1 tablet by mouth every morning.    Historical Provider, MD    Family History Family History  Problem Relation Age of Onset  . Stroke Mother   . Stroke Father   . Diabetes Sister   . Multiple sclerosis Sister     Social History Social History  Substance Use Topics  . Smoking status: Current Every Day Smoker    Packs/day: 0.25    Types: Cigarettes  . Smokeless tobacco: Never Used  . Alcohol use No     Allergies   Patient has no known allergies.   Review of Systems Review of Systems 10 Systems reviewed and are negative for acute change except as noted in the HPI.   Physical Exam Updated Vital Signs BP 130/95 (BP Location: Right Arm)   Pulse 68   Temp 98.2 F (36.8 C) (Oral)   Resp 18   LMP 10/09/2016   SpO2 100%   Physical Exam Physical Exam  Nursing note and vitals reviewed. Constitutional: Well developed, well nourished, non-toxic, and in no acute  distress Head: Normocephalic and atraumatic.  Mouth/Throat: Oropharynx is clear and moist.  Neck: Normal range of motion. Neck supple.  Cardiovascular: Normal rate and regular rhythm.   Pulmonary/Chest: Effort normal and breath sounds normal.  Abdominal: Soft. There is no rebound and no guarding. Right annexal tenderness to palpation. No tenderness at McBurney's point.  Musculoskeletal: Normal range of motion.  Neurological: Alert, no facial droop, fluent speech, moves all extremities symmetrically Skin: Skin is warm and dry.  Psychiatric: Cooperative  ED Treatments / Results  DIAGNOSTIC STUDIES:  Oxygen Saturation is 100% on RA, normal by my interpretation.    COORDINATION OF  CARE:  6:27 PM Discussed treatment plan with pt at bedside which includes a pelvic exam and pt agreed to plan.  Labs (all labs ordered are listed, but only abnormal results are displayed) Labs Reviewed  WET PREP, GENITAL - Abnormal; Notable for the following:       Result Value   Yeast Wet Prep HPF POC PRESENT (*)    WBC, Wet Prep HPF POC MANY (*)    All other components within normal limits  URINALYSIS, ROUTINE W REFLEX MICROSCOPIC - Abnormal; Notable for the following:    Specific Gravity, Urine 1.003 (*)    Hgb urine dipstick MODERATE (*)    Leukocytes, UA SMALL (*)    All other components within normal limits  URINALYSIS, MICROSCOPIC (REFLEX) - Abnormal; Notable for the following:    Bacteria, UA FEW (*)    Squamous Epithelial / LPF 0-5 (*)    All other components within normal limits  PREGNANCY, URINE  GC/CHLAMYDIA PROBE AMP ([place]) NOT AT [facility]    EKG  EKG Interpretation None       Radiology US Transvaginal Non-ob  Result Date: 11/07/2016 CLINICAL DATA:  The lower quadrant pain, low back pain for 2 days EXAM: TRANSABDOMINAL AND TRANSVAGINAL ULTRASOUND OF PELVIS DOPPLER ULTRASOUND OF OVARIES TECHNIQUE: Both transabdominal and transvaginal ultrasound examinations of the pelvis were performed. Transabdominal technique was performed for global imaging of the pelvis including uterus, ovaries, adnexal regions, and pelvic cul-de-sac. It was necessary to proceed with endovaginal exam following the transabdominal exam to visualize the endometrium and ovaries. Color and duplex Doppler ultrasound was utilized to evaluate blood flow to the ovaries. COMPARISON:  None. FINDINGS: Uterus Measurements: 9.2 x 4.4 x 5.9 cm. No fibroids or other mass visualized. Endometrium Thickness: 8.4 mm.  No focal abnormality visualized. Right ovary Measurements: 3 x 1.7 x 1.9 cm. 1.1 x 0.9 x 1.2 cm hypoechoic right ovarian mass without internal Doppler flow which may reflect a small endometrioma or  hemorrhagic cyst. Left ovary Measurements: 3.5 x 1.6 x 2.2 cm. Normal appearance/no adnexal mass. Pulsed Doppler evaluation of both ovaries demonstrates normal low-resistance arterial and venous waveforms. Other findings No abnormal free fluid. IMPRESSION: 1. No active ovarian torsion. 2. 1.1 x 0.9 x 1.2 cm right ovarian avascular mass which may reflect a small endometrioma or hemorrhagic cyst. Short-interval follow up ultrasound in 6-12 weeks is recommended, preferably during the week following the patient's normal menses. Electronically Signed   By: Elige Ko   On: 11/07/2016 19:29   US Pelvis Complete  Result Date: 11/07/2016 CLINICAL DATA:  The lower quadrant pain, low back pain for 2 days EXAM: TRANSABDOMINAL AND TRANSVAGINAL ULTRASOUND OF PELVIS DOPPLER ULTRASOUND OF OVARIES TECHNIQUE: Both transabdominal and transvaginal ultrasound examinations of the pelvis were performed. Transabdominal technique was performed for global imaging of the pelvis including uterus, ovaries, adnexal regions, and pelvic  cul-de-sac. It was necessary to proceed with endovaginal exam following the transabdominal exam to visualize the endometrium and ovaries. Color and duplex Doppler ultrasound was utilized to evaluate blood flow to the ovaries. COMPARISON:  None. FINDINGS: Uterus Measurements: 9.2 x 4.4 x 5.9 cm. No fibroids or other mass visualized. Endometrium Thickness: 8.4 mm.  No focal abnormality visualized. Right ovary Measurements: 3 x 1.7 x 1.9 cm. 1.1 x 0.9 x 1.2 cm hypoechoic right ovarian mass without internal Doppler flow which may reflect a small endometrioma or hemorrhagic cyst. Left ovary Measurements: 3.5 x 1.6 x 2.2 cm. Normal appearance/no adnexal mass. Pulsed Doppler evaluation of both ovaries demonstrates normal low-resistance arterial and venous waveforms. Other findings No abnormal free fluid. IMPRESSION: 1. No active ovarian torsion. 2. 1.1 x 0.9 x 1.2 cm right ovarian avascular mass which may reflect a  small endometrioma or hemorrhagic cyst. Short-interval follow up ultrasound in 6-12 weeks is recommended, preferably during the week following the patient's normal menses. Electronically Signed   By: Elige Ko   On: 11/07/2016 19:29   [place] Art/ven Flow Abd Pelv Doppler  Result Date: 11/07/2016 CLINICAL DATA:  The lower quadrant pain, low back pain for 2 days EXAM: TRANSABDOMINAL AND TRANSVAGINAL ULTRASOUND OF PELVIS DOPPLER ULTRASOUND OF OVARIES TECHNIQUE: Both transabdominal and transvaginal ultrasound examinations of the pelvis were performed. Transabdominal technique was performed for global imaging of the pelvis including uterus, ovaries, adnexal regions, and pelvic cul-de-sac. It was necessary to proceed with endovaginal exam following the transabdominal exam to visualize the endometrium and ovaries. Color and duplex Doppler ultrasound was utilized to evaluate blood flow to the ovaries. COMPARISON:  None. FINDINGS: Uterus Measurements: 9.2 x 4.4 x 5.9 cm. No fibroids or other mass visualized. Endometrium Thickness: 8.4 mm.  No focal abnormality visualized. Right ovary Measurements: 3 x 1.7 x 1.9 cm. 1.1 x 0.9 x 1.2 cm hypoechoic right ovarian mass without internal Doppler flow which may reflect a small endometrioma or hemorrhagic cyst. Left ovary Measurements: 3.5 x 1.6 x 2.2 cm. Normal appearance/no adnexal mass. Pulsed Doppler evaluation of both ovaries demonstrates normal low-resistance arterial and venous waveforms. Other findings No abnormal free fluid. IMPRESSION: 1. No active ovarian torsion. 2. 1.1 x 0.9 x 1.2 cm right ovarian avascular mass which may reflect a small endometrioma or hemorrhagic cyst. Short-interval follow up ultrasound in 6-12 weeks is recommended, preferably during the week following the patient's normal menses. Electronically Signed   By: Elige Ko   On: 11/07/2016 19:29    Procedures Procedures (including critical care time)  Medications Ordered in ED Medications    fluconazole (DIFLUCAN) tablet 200 mg (200 mg Oral Given 11/07/16 1955)     Initial Impression / Assessment and Plan / ED Course  I have reviewed the triage vital signs and the nursing notes.  Pertinent labs & imaging results that were available during my care of the patient were reviewed by me and considered in my medical decision making (see chart for details).     Presenting with 2 days of intermittent right adnexal and low back pain with vaginal spotting and irregular period. She is nontoxic in no acute distress. Vital signs within normal limits. Abdomen soft and benign. No tenderness at McBurney's point, and at this time not concerned about appendicitis surgery abdominal process. Symptoms seems localized to the pelvis. Pelvic exam positive for yeast and given course of Diflucan. Ultrasound of the pelvis obtained to rule out ovarian torsion or cyst. No torsion. Does show small right  ovarian mass versus cyst with recommendations of repeat imaging in 6-12 weeks to make sure this resolves. This is discussed with the patient will follow-up with her gynecologist regarding this issue. She will take Tylenol or ibuprofen in the interim.  Strict return and follow-up instructions reviewed. She expressed understanding of all discharge instructions and felt comfortable with the plan of care.  Final Clinical Impressions(s) / ED Diagnoses   Final diagnoses:  Pelvic pain  Yeast vaginitis  Ovarian mass, right    New Prescriptions New Prescriptions   No medications on file    I personally performed the services described in this documentation, which was scribed in my presence. The recorded information has been reviewed and is accurate.    Lavera Guiseana Duo Suleima Ohlendorf, MD 11/07/16 2002

## 2016-11-07 NOTE — Discharge Instructions (Signed)
You were given a dose of Diflucan for yeast infection.  Your ultrasound shows signs of a right ovarian cyst versus mass. Please follow up closely with her primary care doctor or your gynecologist regarding this. Our radiologist recommended in 6-12 weeks to have repeat ultrasound to make sure this goes away.  In the meantime, you can take ibuprofen and Tylenol for pain control.  Return without fail for worsening symptoms, including fever, intractable vomiting, escalating pain, or any other symptoms concerning to.

## 2016-11-07 NOTE — ED Notes (Signed)
Signature pad not working, pt given DC instructions and opportunity for questions provided

## 2016-11-07 NOTE — ED Triage Notes (Signed)
C/o right lower abd/back pain x 2 days-vaginal spotting x today-LNMP 10/09/16-NAD-steady gait

## 2016-11-07 NOTE — ED Notes (Signed)
Pt c/o irregular periods, states usually every 28 days, states has persistent "ache" at rt flank area, w/ nausea. Able to eat and drink w/o difficulty. Having frequent urination

## 2016-11-07 NOTE — ED Notes (Signed)
Patient transported to Ultrasound 

## 2016-11-10 LAB — GC/CHLAMYDIA PROBE AMP (~~LOC~~) NOT AT ARMC
Chlamydia: NEGATIVE
NEISSERIA GONORRHEA: NEGATIVE

## 2016-11-24 ENCOUNTER — Ambulatory Visit: Payer: Medicaid Other | Admitting: Obstetrics

## 2016-11-26 ENCOUNTER — Ambulatory Visit: Payer: Medicaid Other | Admitting: Obstetrics

## 2016-12-11 ENCOUNTER — Emergency Department (HOSPITAL_BASED_OUTPATIENT_CLINIC_OR_DEPARTMENT_OTHER)
Admission: EM | Admit: 2016-12-11 | Discharge: 2016-12-11 | Disposition: A | Discharge status: Home / Self Care | Payer: Medicaid Other | Attending: Emergency Medicine | Admitting: Emergency Medicine

## 2016-12-11 ENCOUNTER — Encounter (HOSPITAL_BASED_OUTPATIENT_CLINIC_OR_DEPARTMENT_OTHER): Payer: Self-pay | Admitting: *Deleted

## 2016-12-11 ENCOUNTER — Ambulatory Visit (INDEPENDENT_AMBULATORY_CARE_PROVIDER_SITE_OTHER): Payer: Medicaid Other | Admitting: Obstetrics

## 2016-12-11 ENCOUNTER — Encounter: Payer: Self-pay | Admitting: Obstetrics

## 2016-12-11 VITALS — BP 150/105 | HR 80 | Wt 134.0 lb

## 2016-12-11 DIAGNOSIS — F17210 Nicotine dependence, cigarettes, uncomplicated: Secondary | ICD-10-CM | POA: Diagnosis not present

## 2016-12-11 DIAGNOSIS — Z3009 Encounter for other general counseling and advice on contraception: Secondary | ICD-10-CM

## 2016-12-11 DIAGNOSIS — Z791 Long term (current) use of non-steroidal anti-inflammatories (NSAID): Secondary | ICD-10-CM | POA: Diagnosis not present

## 2016-12-11 DIAGNOSIS — Z8669 Personal history of other diseases of the nervous system and sense organs: Secondary | ICD-10-CM | POA: Diagnosis not present

## 2016-12-11 DIAGNOSIS — N946 Dysmenorrhea, unspecified: Secondary | ICD-10-CM | POA: Diagnosis not present

## 2016-12-11 DIAGNOSIS — I10 Essential (primary) hypertension: Secondary | ICD-10-CM | POA: Diagnosis not present

## 2016-12-11 DIAGNOSIS — Z79899 Other long term (current) drug therapy: Secondary | ICD-10-CM | POA: Insufficient documentation

## 2016-12-11 MED ORDER — HYDROMORPHONE HCL 4 MG PO TABS: 4.0000 mg | ORAL_TABLET | Freq: Four times a day (QID) | ORAL | 0 refills | Status: DC | PRN

## 2016-12-11 MED ORDER — AMLODIPINE BESYLATE 5 MG PO TABS: 5.0000 mg | ORAL_TABLET | Freq: Every day | ORAL | 0 refills | Status: DC

## 2016-12-11 MED ORDER — IBUPROFEN 800 MG PO TABS: 800.0000 mg | ORAL_TABLET | Freq: Three times a day (TID) | ORAL | 5 refills | Status: DC | PRN

## 2016-12-11 MED ORDER — HYDROMORPHONE HCL 4 MG PO TABS: 4.0000 mg | ORAL_TABLET | Freq: Three times a day (TID) | ORAL | 0 refills | Status: DC | PRN

## 2016-12-11 MED ORDER — SUMATRIPTAN SUCCINATE 50 MG PO TABS: 50.0000 mg | ORAL_TABLET | ORAL | 6 refills | Status: DC | PRN

## 2016-12-11 MED FILL — AMLODIPINE BESYLATE 5 MG TA: 5 | 30 days supply | Qty: 30 | Fill #0

## 2016-12-11 NOTE — ED Triage Notes (Signed)
She was at her GYN today and her BP was elevated. No hx of HTN. States she has a hx of migraine headaches and she had one last night. No pain today.

## 2016-12-11 NOTE — ED Notes (Signed)
Patient denies pain and is resting comfortably.  

## 2016-12-11 NOTE — Progress Notes (Signed)
[facility] and pain last month. They found mass on US.

## 2016-12-11 NOTE — Progress Notes (Signed)
Subjective:    Shirley [name] is a 36 y.o. female who presents for contraception counseling. The patient has history of migraines and hypertension.  Menstrual periods are 3 day ordeals of migraine HA and severe cramps. The patient is sexually active. She has had a Laparoscopic tubal procedure.  Pertinent past medical history: current smoker, hypertension and migraines.  The information documented in the HPI was reviewed and verified.  Menstrual History: OB History    Gravida Para Term Preterm AB Living   5 3 3   2 3    SAB TAB Ectopic Multiple Live Births   1 0     3       Patient's last menstrual period was 12/04/2016.   Patient Active Problem List   Diagnosis Date Noted  . Slurred speech 09/28/2014  . Migraine 09/28/2014  . Weakness of hand   . Unspecified symptom associated with female genital organs 03/07/2013  . Allergic rhinitis, seasonal 03/07/2013  . GERD (gastroesophageal reflux disease) 03/07/2013   Past Medical History:  Diagnosis Date  . Double vision   . Medical history non-contributory   . Numbness   . Weakness of hand     Past Surgical History:  Procedure Laterality Date  . BREAST LUMPECTOMY    . DILATION AND CURETTAGE OF UTERUS    . LAPAROSCOPIC TUBAL LIGATION Bilateral 08/05/2013   Procedure: LAPAROSCOPIC TUBAL LIGATION;  Surgeon: Antionette CharLisa Jackson-Moore, MD;  Location: [facility];  Service: Gynecology;  Laterality: Bilateral;  . rods in feet    . ROTATOR CUFF REPAIR       Current Outpatient Prescriptions:  .  HYDROmorphone (DILAUDID) 4 MG tablet, Take 1 tablet (4 mg total) by mouth every 6 (six) hours as needed for moderate pain or severe pain., Disp: 20 tablet, Rfl: 0 .  ibuprofen (ADVIL,MOTRIN) 800 MG tablet, Take 1 tablet (800 mg total) by mouth every 8 (eight) hours as needed., Disp: 30 tablet, Rfl: 5 .  Prenatal Vit-Fe Fumarate-FA (PRENATAL MULTIVITAMIN) TABS, Take 1 tablet by mouth every morning., Disp: , Rfl:  .  SUMAtriptan (IMITREX) 50 MG tablet, Take 1  tablet (50 mg total) by mouth every 2 (two) hours as needed for migraine or headache. May repeat in 2 hours if headache persists or recurs., Disp: 15 tablet, Rfl: 6 No Known Allergies  Social History  Substance Use Topics  . Smoking status: Current Every Day Smoker    Packs/day: 0.25    Types: Cigarettes  . Smokeless tobacco: Never Used  . Alcohol use No    Family History  Problem Relation Age of Onset  . Stroke Mother   . Stroke Father   . Diabetes Sister   . Multiple sclerosis Sister        Review of Systems Constitutional: negative for weight loss Genitourinary:positive for abnormally painful menstrual periods and  Neuro:  Positive for migraine HA's  Objective:   BP (!) 150/105   Pulse 80   Wt 134 lb (60.8 kg)   LMP 12/04/2016   BMI 19.23 kg/m    PE:  Deferred  Lab Review Urine pregnancy test Labs reviewed yes Radiologic studies reviewed yes  >50% of 15 min visit spent on counseling and coordination of care.    Assessment:    36 y.o., continuing tubal ligation, no contraindications.    Severe Dysmenorrhea.  Migraines  Hypertension  Plan:   Mirena IUD recommended for cycle control.  Was amenorrheic with Mirena IUD in the past. Imitrex Rx for Migraines.  May need  Neurology referral. Sent to ER for elevated BP.  Also referred to IM for follow up and Health Maintenance. F/U in 2 weeks   All questions answered. Discussed healthy lifestyle modifications. Follow up in 2 weeks.     No orders of the defined types were placed in this encounter.    Patient ID: Shirley [name], female   DOB: 03-22-1981, 36 y.o.   MRN: [id number]

## 2016-12-11 NOTE — Discharge Instructions (Signed)
Follow-up with the internal medicine appointment with Dr. Clearance CootsHarper has arranged for you.

## 2016-12-11 NOTE — Addendum Note (Signed)
Addended by: Coral CeoHARPER, CHARLES A on: 12/11/2016 12:48 PM   Modules accepted: Orders

## 2016-12-11 NOTE — ED Provider Notes (Signed)
[facility]   CSN: [id number] Arrival date & time: 12/11/16  1422     History   Chief Complaint Chief Complaint  Patient presents with  . Hypertension    HPI Shirley [name] is a 36 y.o. female.  HPI Patient presents with high blood pressure. Sent in by her GYN today. She was there for an unrelated issue. Has occasional headaches. History of migraines and states that was typical migraine. No chest pain. Slight occasional shoulder pain that feels like she slept on her own. No pain with exertion. No numbness weakness. States her blood pressure is elevated when she had a previous syncopal episode but does not normally have high blood pressure that she knows about. She does not however see her primary care doctor. She does smoke cigarettes.   Past Medical History:  Diagnosis Date  . Double vision   . Medical history non-contributory   . Numbness   . Weakness of hand     Patient Active Problem List   Diagnosis Date Noted  . Slurred speech 09/28/2014  . Migraine 09/28/2014  . Weakness of hand   . Unspecified symptom associated with female genital organs 03/07/2013  . Allergic rhinitis, seasonal 03/07/2013  . GERD (gastroesophageal reflux disease) 03/07/2013    Past Surgical History:  Procedure Laterality Date  . BREAST LUMPECTOMY    . DILATION AND CURETTAGE OF UTERUS    . LAPAROSCOPIC TUBAL LIGATION Bilateral 08/05/2013   Procedure: LAPAROSCOPIC TUBAL LIGATION;  Surgeon: Antionette Char, MD;  Location: [facility];  Service: Gynecology;  Laterality: Bilateral;  . rods in feet    . ROTATOR CUFF REPAIR      OB History    Gravida Para Term Preterm AB Living   5 3 3   2 3    SAB TAB Ectopic Multiple Live Births   1 0     3       Home Medications    Prior to Admission medications   Medication Sig Start Date End Date Taking? Authorizing Provider  amLODipine (NORVASC) 5 MG tablet Take 1 tablet (5 mg total) by mouth daily. 12/11/16   Benjiman Core, MD  HYDROmorphone (DILAUDID) 4 MG tablet Take 1 tablet (4 mg total) by mouth every 6 (six) hours as needed for moderate pain or severe pain. 12/11/16   Brock Bad, MD  ibuprofen (ADVIL,MOTRIN) 800 MG tablet Take 1 tablet (800 mg total) by mouth every 8 (eight) hours as needed. 12/11/16   Brock Bad, MD  Prenatal Vit-Fe Fumarate-FA (PRENATAL MULTIVITAMIN) TABS Take 1 tablet by mouth every morning.    Historical Provider, MD  SUMAtriptan (IMITREX) 50 MG tablet Take 1 tablet (50 mg total) by mouth every 2 (two) hours as needed for migraine or headache. May repeat in 2 hours if headache persists or recurs. 12/11/16   Brock Bad, MD    Family History Family History  Problem Relation Age of Onset  . Stroke Mother   . Stroke Father   . Diabetes Sister   . Multiple sclerosis Sister     Social History Social History  Substance Use Topics  . Smoking status: Current Every Day Smoker    Packs/day: 0.25    Types: Cigarettes  . Smokeless tobacco: Never Used  . Alcohol use No     Allergies   Patient has no known allergies.   Review of Systems Review of Systems  Constitutional: Negative for appetite change.  HENT: Negative for congestion.  Respiratory: Negative for chest tightness.   Cardiovascular: Negative for chest pain.  Gastrointestinal: Negative for abdominal pain.  Genitourinary: Negative for difficulty urinating.  Musculoskeletal: Negative for arthralgias.  Neurological: Positive for headaches.  Hematological: Negative for adenopathy.  Psychiatric/Behavioral: Negative for confusion.     Physical Exam Updated Vital Signs BP (!) 155/115   Pulse 78   Temp 98.7 F (37.1 C) (Oral)   Resp 16   Ht 5\' 10"  (1.778 m)   Wt 134 lb (60.8 kg)   LMP 12/04/2016   SpO2 100%   BMI 19.23 kg/m   Physical Exam  Constitutional: She appears well-developed.  HENT:  Head: Atraumatic.  Neck: Neck supple.  Cardiovascular: Normal rate.   Pulmonary/Chest:  Effort normal.  Abdominal: Soft.  Musculoskeletal: She exhibits no edema.  Neurological: She is alert.  Skin: Skin is warm. Capillary refill takes less than 2 seconds.  Psychiatric: She has a normal mood and affect.     ED Treatments / Results  Labs (all labs ordered are listed, but only abnormal results are displayed) Labs Reviewed - No data to display  EKG  EKG Interpretation None       Radiology No results found.  Procedures Procedures (including critical care time)  Medications Ordered in ED Medications - No data to display   Initial Impression / Assessment and Plan / ED Course  I have reviewed the triage vital signs and the nursing notes.  Pertinent labs & imaging results that were available during my care of the patient were reviewed by me and considered in my medical decision making (see chart for details).    patient presents high blood pressure. Around 150/100. Similar level in office today. Doubt end organ damage at this time. GYN has arranged internal medicine follow-up. Will start her empirically on Norvasc. Discharge home.  Final Clinical Impressions(s) / ED Diagnoses   Final diagnoses:  Hypertension, unspecified type    New Prescriptions New Prescriptions   AMLODIPINE (NORVASC) 5 MG TABLET    Take 1 tablet (5 mg total) by mouth daily.     Benjiman CoreNathan Mi Balla, MD 12/11/16 [phone]

## 2016-12-25 ENCOUNTER — Ambulatory Visit (INDEPENDENT_AMBULATORY_CARE_PROVIDER_SITE_OTHER): Payer: Medicaid Other | Admitting: Certified Nurse Midwife

## 2016-12-25 ENCOUNTER — Encounter: Payer: Self-pay | Admitting: Obstetrics

## 2016-12-25 VITALS — BP 151/93 | HR 77 | Wt 136.0 lb

## 2016-12-25 DIAGNOSIS — Z30430 Encounter for insertion of intrauterine contraceptive device: Secondary | ICD-10-CM

## 2016-12-25 DIAGNOSIS — I10 Essential (primary) hypertension: Secondary | ICD-10-CM

## 2016-12-25 DIAGNOSIS — N939 Abnormal uterine and vaginal bleeding, unspecified: Secondary | ICD-10-CM

## 2016-12-25 DIAGNOSIS — Z3202 Encounter for pregnancy test, result negative: Secondary | ICD-10-CM | POA: Diagnosis not present

## 2016-12-25 LAB — POCT URINE PREGNANCY: Preg Test, Ur: NEGATIVE

## 2016-12-25 MED ORDER — LEVONORGESTREL 18.6 MCG/DAY IU IUD
INTRAUTERINE_SYSTEM | Freq: Once | INTRAUTERINE | Status: AC
  Administered 2016-12-25: 17:00:00 via INTRAUTERINE

## 2016-12-25 NOTE — Progress Notes (Signed)
Pt presents for Liletta insertion dx: abn. UT bleeding.

## 2016-12-25 NOTE — Progress Notes (Signed)
IUD Procedure Note   DIAGNOSIS: AUB, hx of BTL   PROCEDURE: IUD placement Performing Provider: Orvilla Cornwallachelle Marimar Suber CNM  Patient counseled prior to procedure. I explained risks and benefits of Lyletta IUD, reviewed alternative forms of contraception. Patient stated understanding and consented to continue with procedure.   LMP: 12/04/16 Pregnancy Test: Negative Lot #: [id number] Expiration Date: 12/2019   IUD type: [   ] Mirena   [   ] Paragard  [X]  Lyletta   [   ]  Kyleena  PROCEDURE:  Timeout procedure was performed to ensure right patient and right site.  A bimanual exam was performed to determine the position of the uterus, retroverted, retroflexed. The speculum was placed. The vagina and cervix was sterilized in the usual manner and sterile technique was maintained throughout the course of the procedure. A single toothed tenaculum was applied to the posterior lip of the cervix and gentle traction applied. The depth of the uterus was sounded to 11 cm. With gentle traction on the tenaculum, cervical dilators were used, cervix was on the left side of vaginal walls.  Once dilated the IUD was inserted to the appropriate depth and inserted without difficulty.  The string was cut to an estimated 4 cm length. Bleeding was minimal. The patient tolerated the procedure well. Motrin given for cramping.    Follow up: The patient tolerated the procedure well without complications.  Standard post-procedure care is explained and return precautions are given.  Orvilla Cornwallachelle Livvy Spilman CNM

## 2017-01-08 ENCOUNTER — Ambulatory Visit (INDEPENDENT_AMBULATORY_CARE_PROVIDER_SITE_OTHER): Payer: Medicaid Other | Admitting: Neurology

## 2017-01-08 ENCOUNTER — Encounter: Payer: Self-pay | Admitting: Neurology

## 2017-01-08 VITALS — BP 159/104 | HR 72 | Ht 70.0 in | Wt 136.0 lb

## 2017-01-08 DIAGNOSIS — G43109 Migraine with aura, not intractable, without status migrainosus: Secondary | ICD-10-CM | POA: Diagnosis not present

## 2017-01-08 MED ORDER — ONDANSETRON 4 MG PO TBDP: 4.0000 mg | ORAL_TABLET | Freq: Three times a day (TID) | ORAL | 6 refills | Status: DC | PRN

## 2017-01-08 MED ORDER — RIZATRIPTAN BENZOATE 10 MG PO TBDP: 10.0000 mg | ORAL_TABLET | ORAL | 6 refills | Status: DC | PRN

## 2017-01-08 MED ORDER — PROPRANOLOL HCL ER 80 MG PO CP24: 80.0000 mg | ORAL_CAPSULE | Freq: Two times a day (BID) | ORAL | 11 refills | Status: DC

## 2017-01-08 MED ORDER — PROPRANOLOL HCL 80 MG PO TABS: 80.0000 mg | ORAL_TABLET | Freq: Two times a day (BID) | ORAL | 11 refills | Status: DC

## 2017-01-08 NOTE — Patient Instructions (Signed)
You May take Maxalt together with  Zofran Aleve Benadryl

## 2017-01-08 NOTE — Progress Notes (Signed)
PATIENT: Shirley [name] DOB: 1981/04/23  Chief Complaint  Patient presents with  . Migraine    She was last seen 10/24/14.  She had stopped having migraines until August 2017.  She estimates 8 headache days per month.  She has tried ibuprofen 800mg  and sumatriptan without relief.  She often has blurred vison, sensitivity to light/sound and nausea.       HISTORICAL  Shirley [name] is a 36 years old right-handed female, came in for continued migraine,  I saw her previously last visit was in January 2016. Initial visit was in 2015, she was referred by emergency room for evaluation of her complains, she does not have primary care physician  Around September 08 2014, she had developed loss of peripheral vision, seeing spots in her visual field, lasting 30 minutes, followed by severe holo-cranial pounding headaches, with associated light noise sensitivity, she slept for couple hours, headache has improved,  She drove to [facility] time, after driving, she noticed right leg numbness tingling, traveling from right groin area, posterior right thigh, right lateral leg, involving right foot, top, and the bottom  Her right leg numbness, mild weakness has been persistent since its onset, she also noticed intermittent right hand numbness, weakness, difficulty opening a door knob,  lasting for 3 days, in early December, also reported episode of slurred speech, blank stare, lasting for few seconds,   She presented to emergency room September 22 2014, CT head was normal  Labs, mild anemia, hemoglobin 11.7, otherwise normal CBC, CMP,  She now continue has right leg numbness, subjective weakness of right hand, right leg, denies visual loss   UPDATE Jan 5th 2016: MRI of the brain was normal  She has worsening right leg numbness, also has more right arm numbness extending from right elbow to her right fingers, she denies significant weakness She has frequent headaches daily mild to  moderate pressure holoacranial headaches, tinnitus of bilateral ear,  Update January 08 2017: She has lost follow-up since last visit in January 2016, because her headache is doing so well, she only has occasionally recurrence, but since summer of 2017, she began to have frequent headaches again, she reported one episode she had such severe headache she passed out.   She reported gradually increased frequency and severity of the headache, now she has headaches 2-3 times each week, lateralized severe pounding headache with associated light noise sensitivity, blurry vision, movement made it worse, nauseous. She has tried ibuprofen 800 mg with limited help, was given the prescription of Imitrex 50 mg as needed since March 2018, she tried more than 5 times without help at all.  She also described visual distortion, flashing light in peripheral visual field before and during severe headaches. We have personally reviewed MRI of the brain in 2015 there was no significant abnormality, MRI cervical spine January 2016, mild degenerative changessignificant canal foraminal stenosis.   REVIEW OF SYSTEMS: Full 14 system review of systems performed and notable only for passing out, headaches, blurry vision, palpitation  ALLERGIES: No Known Allergies  HOME MEDICATIONS: Current Outpatient Prescriptions  Medication Sig Dispense Refill  . amLODipine (NORVASC) 5 MG tablet Take 1 tablet (5 mg total) by mouth daily. 30 tablet 0  . HYDROmorphone (DILAUDID) 4 MG tablet Take 1 tablet (4 mg total) by mouth every 6 (six) hours as needed for moderate pain or severe pain. 20 tablet 0  . ibuprofen (ADVIL,MOTRIN) 800 MG tablet Take 1 tablet (800 mg total) by mouth every 8 (  eight) hours as needed. 30 tablet 5  . SUMAtriptan (IMITREX) 50 MG tablet Take 1 tablet (50 mg total) by mouth every 2 (two) hours as needed for migraine or headache. May repeat in 2 hours if headache persists or recurs. 15 tablet 6   No current  facility-administered medications for this visit.     PAST MEDICAL HISTORY: Past Medical History:  Diagnosis Date  . Double vision   . Medical history non-contributory   . Migraines   . Numbness   . Severe menstrual cramps   . Weakness of hand     PAST SURGICAL HISTORY: Past Surgical History:  Procedure Laterality Date  . BREAST LUMPECTOMY    . DILATION AND CURETTAGE OF UTERUS    . LAPAROSCOPIC TUBAL LIGATION Bilateral 08/05/2013   Procedure: LAPAROSCOPIC TUBAL LIGATION;  Surgeon: Antionette CharLisa Jackson-Moore, MD;  Location: [facility];  Service: Gynecology;  Laterality: Bilateral;  . rods in feet    . ROTATOR CUFF REPAIR      FAMILY HISTORY: Family History  Problem Relation Age of Onset  . Stroke Mother   . Abdominal Wall Hernia Mother   . Hypertension Mother   . Hypertension Father   . Heart disease Father     pacemaker  . Diabetes Sister   . Multiple sclerosis Sister   . Liver cancer Maternal Grandmother   . Ovarian cancer Maternal Grandmother   . Pancreatic cancer Paternal Grandmother     SOCIAL HISTORY:  Social History   Social History  . Marital status: Single    Spouse name: N/A  . Number of children: 3  . Years of education: college   Occupational History  . Homemaker    Social History Main Topics  . Smoking status: Current Every Day Smoker    Packs/day: 0.25    Types: Cigarettes  . Smokeless tobacco: Never Used  . Alcohol use No  . Drug use: No  . Sexual activity: Yes    Partners: Male    Birth control/ protection: Surgical   Other Topics Concern  . Not on file   Social History Narrative   Patient lives at home with her three children.    Patient is a homemaker.   Education college education.   Right handed.    2 cups coffee, 1 bottle of [employer]ountain Dew daily.        PHYSICAL EXAM   Vitals:   01/08/17 1506  BP: (!) 159/104  Pulse: 72  Weight: 136 lb (61.7 kg)  Height: 5\' 10"  (1.778 m)    Not recorded      Body mass index is 19.51  kg/m.  PHYSICAL EXAMNIATION:  Gen: NAD, conversant, well nourised, obese, well groomed                     Cardiovascular: Regular rate rhythm, no peripheral edema, warm, nontender. Eyes: Conjunctivae clear without exudates or hemorrhage Neck: Supple, no carotid bruits. Pulmonary: Clear to auscultation bilaterally   NEUROLOGICAL EXAM:  MENTAL STATUS: Speech:    Speech is normal; fluent and spontaneous with normal comprehension.  Cognition:     Orientation to time, place and person     Normal recent and remote memory     Normal Attention span and concentration     Normal Language, naming, repeating,spontaneous speech     Fund of knowledge   CRANIAL NERVES: CN II: Visual fields are full to confrontation. Fundoscopic exam is normal with sharp discs and no vascular changes. Pupils are  round equal and briskly reactive to light. CN III, IV, VI: extraocular movement are normal. No ptosis. CN V: Facial sensation is intact to pinprick in all 3 divisions bilaterally. Corneal responses are intact.  CN VII: Face is symmetric with normal eye closure and smile. CN VIII: Hearing is normal to rubbing fingers CN IX, X: Palate elevates symmetrically. Phonation is normal. CN XI: Head turning and shoulder shrug are intact CN XII: Tongue is midline with normal movements and no atrophy.  MOTOR: There is no pronator drift of out-stretched arms. Muscle bulk and tone are normal. Muscle strength is normal.  REFLEXES: Reflexes are 2+ and symmetric at the biceps, triceps, knees, and ankles. Plantar responses are flexor.  SENSORY: Intact to light touch, pinprick, positional sensation and vibratory sensation are intact in fingers and toes.  COORDINATION: Rapid alternating movements and fine finger movements are intact. There is no dysmetria on finger-to-nose and heel-knee-shin.    GAIT/STANCE: Posture is normal. Gait is steady with normal steps, base, arm swing, and turning. Heel and toe walking are  normal. Tandem gait is normal.  Romberg is absent.   DIAGNOSTIC DATA (LABS, IMAGING, TESTING) - I reviewed patient records, labs, notes, testing and imaging myself where available.   ASSESSMENT AND PLAN  Shirley [name] is a 36 y.o. female   Chronic migraine with aura  We will try preventive medications Inderal 80 mg twice a day  Maxalt prn   Shirley [name], M.D. Ph.D.  [facility] [address], [address] [place], [place] [address] Ph: [phone] Fax: [phone]  CC: Referring Provider

## 2017-01-26 ENCOUNTER — Ambulatory Visit: Payer: Medicaid Other | Admitting: Obstetrics

## 2017-02-03 ENCOUNTER — Telehealth: Payer: Self-pay | Admitting: *Deleted

## 2017-02-03 ENCOUNTER — Ambulatory Visit: Payer: Medicaid Other | Admitting: Nurse Practitioner

## 2017-02-03 NOTE — Telephone Encounter (Signed)
LVM informing patient that the office is closed due to power outage. Advised she call tomorrow or later this week to reschedule her FU with Enid Skeens, NP. Left office number.

## 2017-02-22 ENCOUNTER — Emergency Department (HOSPITAL_BASED_OUTPATIENT_CLINIC_OR_DEPARTMENT_OTHER)
Admission: EM | Admit: 2017-02-22 | Discharge: 2017-02-22 | Disposition: A | Discharge status: Home / Self Care | Payer: Medicaid Other | Attending: Physician Assistant | Admitting: Physician Assistant

## 2017-02-22 ENCOUNTER — Encounter (HOSPITAL_BASED_OUTPATIENT_CLINIC_OR_DEPARTMENT_OTHER): Payer: Self-pay | Admitting: *Deleted

## 2017-02-22 DIAGNOSIS — F17210 Nicotine dependence, cigarettes, uncomplicated: Secondary | ICD-10-CM | POA: Diagnosis not present

## 2017-02-22 DIAGNOSIS — Z791 Long term (current) use of non-steroidal anti-inflammatories (NSAID): Secondary | ICD-10-CM | POA: Diagnosis not present

## 2017-02-22 DIAGNOSIS — G43709 Chronic migraine without aura, not intractable, without status migrainosus: Secondary | ICD-10-CM | POA: Diagnosis not present

## 2017-02-22 DIAGNOSIS — R21 Rash and other nonspecific skin eruption: Secondary | ICD-10-CM | POA: Diagnosis present

## 2017-02-22 DIAGNOSIS — Z79899 Other long term (current) drug therapy: Secondary | ICD-10-CM | POA: Diagnosis not present

## 2017-02-22 MED ORDER — SODIUM CHLORIDE 0.9 % IV BOLUS (SEPSIS)
1000.0000 mL | Freq: Once | INTRAVENOUS | Status: AC
  Administered 2017-02-22: 1000 mL via INTRAVENOUS

## 2017-02-22 MED ORDER — KETOROLAC TROMETHAMINE 30 MG/ML IJ SOLN
30.0000 mg | Freq: Once | INTRAMUSCULAR | Status: AC
  Administered 2017-02-22: 30 mg via INTRAVENOUS
  Filled 2017-02-22: qty 1

## 2017-02-22 MED ORDER — DIPHENHYDRAMINE HCL 50 MG/ML IJ SOLN
25.0000 mg | Freq: Once | INTRAMUSCULAR | Status: AC
  Administered 2017-02-22: 25 mg via INTRAVENOUS
  Filled 2017-02-22: qty 1

## 2017-02-22 MED ORDER — PROCHLORPERAZINE EDISYLATE 5 MG/ML IJ SOLN
10.0000 mg | Freq: Once | INTRAMUSCULAR | Status: AC
  Administered 2017-02-22: 10 mg via INTRAVENOUS
  Filled 2017-02-22: qty 2

## 2017-02-22 NOTE — Discharge Instructions (Signed)
Follow up with your neurologist for additional migraine treatment.

## 2017-02-22 NOTE — ED Provider Notes (Signed)
[facility]   CSN: [id number] Arrival date & time: 02/22/17  0016     History   Chief Complaint Chief Complaint  Patient presents with  . Headache    HPI Shirley [name] is a 36 y.o. female.  HPI   35 year old female with history of headaches presenting with headache. Patient has history of migraine sees a neurologist and is on Imitrex. This headache has not got away with Imitrex. She reports mild blurred vision and headache for the last 3 days.  No trauma. No pregnancy. These headaches have been coming and going since January.  Past Medical History:  Diagnosis Date  . Double vision   . Medical history non-contributory   . Migraines   . Numbness   . Severe menstrual cramps   . Weakness of hand     Patient Active Problem List   Diagnosis Date Noted  . Slurred speech 09/28/2014  . Migraine 09/28/2014  . Weakness of hand   . Unspecified symptom associated with female genital organs 03/07/2013  . Allergic rhinitis, seasonal 03/07/2013  . GERD (gastroesophageal reflux disease) 03/07/2013    Past Surgical History:  Procedure Laterality Date  . BREAST LUMPECTOMY    . DILATION AND CURETTAGE OF UTERUS    . LAPAROSCOPIC TUBAL LIGATION Bilateral 08/05/2013   Procedure: LAPAROSCOPIC TUBAL LIGATION;  Surgeon: Antionette Char, MD;  Location: [facility];  Service: Gynecology;  Laterality: Bilateral;  . rods in feet    . ROTATOR CUFF REPAIR      OB History    Gravida Para Term Preterm AB Living   5 3 3   2 3    SAB TAB Ectopic Multiple Live Births   1 0     3       Home Medications    Prior to Admission medications   Medication Sig Start Date End Date Taking? Authorizing Provider  amLODipine (NORVASC) 5 MG tablet Take 1 tablet (5 mg total) by mouth daily. 12/11/16   Benjiman Core, MD  HYDROmorphone (DILAUDID) 4 MG tablet Take 1 tablet (4 mg total) by mouth every 6 (six) hours as needed for moderate pain or severe pain. 12/11/16   Brock Bad, MD  ibuprofen (ADVIL,MOTRIN) 800 MG tablet Take 1 tablet (800 mg total) by mouth every 8 (eight) hours as needed. 12/11/16   Brock Bad, MD  ondansetron (ZOFRAN ODT) 4 MG disintegrating tablet Take 1 tablet (4 mg total) by mouth every 8 (eight) hours as needed. 01/08/17   Levert Feinstein, MD  propranolol (INDERAL) 80 MG tablet Take 1 tablet (80 mg total) by mouth 2 (two) times daily. 01/08/17   Levert Feinstein, MD  rizatriptan (MAXALT-MLT) 10 MG disintegrating tablet Take 1 tablet (10 mg total) by mouth as needed. May repeat in 2 hours if needed 01/08/17   Levert Feinstein, MD  SUMAtriptan (IMITREX) 50 MG tablet Take 1 tablet (50 mg total) by mouth every 2 (two) hours as needed for migraine or headache. May repeat in 2 hours if headache persists or recurs. 12/11/16   Brock Bad, MD    Family History Family History  Problem Relation Age of Onset  . Stroke Mother   . Abdominal Wall Hernia Mother   . Hypertension Mother   . Hypertension Father   . Heart disease Father     pacemaker  . Diabetes Sister   . Multiple sclerosis Sister   . Liver cancer Maternal Grandmother   . Ovarian cancer Maternal Grandmother   .  Pancreatic cancer Paternal Grandmother     Social History Social History  Substance Use Topics  . Smoking status: Current Every Day Smoker    Packs/day: 0.25    Types: Cigarettes  . Smokeless tobacco: Never Used  . Alcohol use No     Allergies   Patient has no known allergies.   Review of Systems Review of Systems  Constitutional: Negative for activity change.  Eyes: Positive for visual disturbance.  Respiratory: Negative for shortness of breath.   Cardiovascular: Negative for chest pain.  Gastrointestinal: Negative for abdominal pain.  Neurological: Positive for headaches.  All other systems reviewed and are negative.    Physical Exam Updated Vital Signs BP (!) 144/97 (BP Location: Left Arm)   Pulse 71   Temp 98.1 F (36.7 C) (Oral)   Resp 16   Ht 5'  10" (1.778 m)   Wt 140 lb (63.5 kg)   LMP 02/22/2017   SpO2 100%   BMI 20.09 kg/m   Physical Exam  Constitutional: She is oriented to person, place, and time. She appears well-developed and well-nourished.  HENT:  Head: Normocephalic and atraumatic.  Eyes: Right eye exhibits no discharge.  Cardiovascular: Normal rate.   Pulmonary/Chest: Effort normal.  Neurological: She is oriented to person, place, and time.  Equal strength bilaterally upper and lower extremities negative pronator drift. Normal sensation bilaterally. Speech comprehensible, no slurring. Facial nerve tested and appears grossly normal. Alert and oriented 3.   Skin: Skin is warm and dry. She is not diaphoretic.  Psychiatric: She has a normal mood and affect.  Nursing note and vitals reviewed.    ED Treatments / Results  Labs (all labs ordered are listed, but only abnormal results are displayed) Labs Reviewed - No data to display  EKG  EKG Interpretation None       Radiology No results found.  Procedures Procedures (including critical care time)  Medications Ordered in ED Medications  sodium chloride 0.9 % bolus 1,000 mL (not administered)  prochlorperazine (COMPAZINE) injection 10 mg (not administered)  diphenhydrAMINE (BENADRYL) injection 25 mg (not administered)  ketorolac (TORADOL) 30 MG/ML injection 30 mg (not administered)     Initial Impression / Assessment and Plan / ED Course  I have reviewed the triage vital signs and the nursing notes.  Pertinent labs & imaging results that were available during my care of the patient were reviewed by me and considered in my medical decision making (see chart for details).     Well-appearing 36 year old female presenting with migraine. Patient has history of migraines. This is similar headache with blurred vision, not getting better with Imitrex. We'll treat with IV treatment and have her follow-up with her neurologist.  Final Clinical Impressions(s)  / ED Diagnoses   Final diagnoses:  None    New Prescriptions New Prescriptions   No medications on file     Abelino DerrickMackuen, Caeson Filippi Lyn, MD 02/22/17 0150

## 2017-02-22 NOTE — ED Triage Notes (Addendum)
Pt c/o of a "migraine" headache that started 3 days ago. c/o pain to left side of her head. States headaches are becoming more frequent. States hx of migraines. c/o light sensitivity. c/o blurred vision. Denies any n/v. Has taken imitrex without relief. Denies taking any other medication.

## 2017-03-04 ENCOUNTER — Encounter: Payer: Self-pay | Admitting: Nurse Practitioner

## 2017-03-04 ENCOUNTER — Encounter (INDEPENDENT_AMBULATORY_CARE_PROVIDER_SITE_OTHER): Payer: Self-pay

## 2017-03-04 ENCOUNTER — Ambulatory Visit (INDEPENDENT_AMBULATORY_CARE_PROVIDER_SITE_OTHER): Payer: Medicaid Other | Admitting: Nurse Practitioner

## 2017-03-04 VITALS — BP 138/94 | HR 80 | Ht 70.0 in | Wt 142.4 lb

## 2017-03-04 DIAGNOSIS — G43109 Migraine with aura, not intractable, without status migrainosus: Secondary | ICD-10-CM | POA: Diagnosis not present

## 2017-03-04 NOTE — Progress Notes (Signed)
I have reviewed and agreed above plan. 

## 2017-03-04 NOTE — Progress Notes (Signed)
[facility]  PATIENT: Shirley [name] DOB: Apr 06, 1981   REASON FOR VISIT: Follow-up for migraine headaches ER visit  02/22/17 HISTORY FROM: Patient    HISTORY OF PRESENT ILLNESS:Shirley [name] is a 36 years old right-handed female, came in for continued migraine,  I saw her previously last visit was in January 2016. Initial visit was in 2015, she was referred by emergency room for evaluation of her complains, she does not have primary care physician  Around September 08 2014, she had developed loss of peripheral vision, seeing spots in her visual field, lasting 30 minutes, followed by severe holo-cranial pounding headaches, with associated light noise sensitivity, she slept for couple hours, headache has improved,  She drove to [facility] time, after driving, she noticed right leg numbness tingling, traveling from right groin area, posterior right thigh, right lateral leg, involving right foot, top, and the bottom  Her right leg numbness, mild weakness has been persistent since its onset, she also noticed intermittent right hand numbness, weakness, difficulty opening a door knob, lasting for 3 days, in early December, also reported episode of slurred speech, blank stare, lasting for few seconds,   She presented to emergency room September 22 2014, CT head was normal  Labs, mild anemia, hemoglobin 11.7, otherwise normal CBC, CMP,  She now continue has right leg numbness, subjective weakness of right hand, right leg, denies visual loss   UPDATE Jan 5th 2016:YY MRI of the brain was normal  She has worsening right leg numbness, also has more right arm numbness extending from right elbow to her right fingers, she denies significant weakness She has frequent headaches daily mild to moderate pressure holoacranial headaches, tinnitus of bilateral ear,  Update January 08 2017:YY She has lost follow-up since last visit in January 2016, because her  headache is doing so well, she only has occasionally recurrence, but since summer of 2017, she began to have frequent headaches again, she reported one episode she had such severe headache she passed out.  She reported gradually increased frequency and severity of the headache, now she has headaches 2-3 times each week, lateralized severe pounding headache with associated light noise sensitivity, blurry vision, movement made it worse, nauseous. She has tried ibuprofen 800 mg with limited help, was given the prescription of Imitrex 50 mg as needed since March 2018, she tried more than 5 times without help at all.  She also described visual distortion, flashing light in peripheral visual field before and during severe headaches. We have personally reviewed MRI of the brain in 2015 there was no significant abnormality, MRI cervical spine January 2016, mild degenerative changessignificant canal foraminal stenosis.  UPDATE 05/16/2018[name], 36 year old female returns for follow-up with history of migraine headaches. She went to the ER on 02/22/17 and received migraine cocktail for a headache that is been going on for 3 days. Her headaches are severe pounding associated with light noise sensitivity blurry vision , nausea and movement makes it worse.  She is currently on Inderal 80 mg twice daily as preventive and Imitrex acutely. She is not aware of any migraine triggers. Except her cycles she may have more headaches. She returns for reevaluation   REVIEW OF SYSTEMS: Full 14 system review of systems performed and notable only for those listed, all others are neg:  Constitutional: neg  Cardiovascular: neg Ear/Nose/Throat: neg  Skin: neg Eyes: Light sensitivity Respiratory: neg Gastroitestinal: neg  Hematology/Lymphatic: neg  Endocrine: neg Musculoskeletal:neg Allergy/Immunology: neg Neurological: Migraine headache Psychiatric: neg  Sleep : neg   ALLERGIES: No Known Allergies  HOME  MEDICATIONS: Outpatient Medications Prior to Visit  Medication Sig Dispense Refill  . amLODipine (NORVASC) 5 MG tablet Take 1 tablet (5 mg total) by mouth daily. 30 tablet 0  . HYDROmorphone (DILAUDID) 4 MG tablet Take 1 tablet (4 mg total) by mouth every 6 (six) hours as needed for moderate pain or severe pain. 20 tablet 0  . ibuprofen (ADVIL,MOTRIN) 800 MG tablet Take 1 tablet (800 mg total) by mouth every 8 (eight) hours as needed. 30 tablet 5  . ondansetron (ZOFRAN ODT) 4 MG disintegrating tablet Take 1 tablet (4 mg total) by mouth every 8 (eight) hours as needed. 30 tablet 6  . propranolol (INDERAL) 80 MG tablet Take 1 tablet (80 mg total) by mouth 2 (two) times daily. 60 tablet 11  . rizatriptan (MAXALT-MLT) 10 MG disintegrating tablet Take 1 tablet (10 mg total) by mouth as needed. May repeat in 2 hours if needed 15 tablet 6  . SUMAtriptan (IMITREX) 50 MG tablet Take 1 tablet (50 mg total) by mouth every 2 (two) hours as needed for migraine or headache. May repeat in 2 hours if headache persists or recurs. 15 tablet 6   No facility-administered medications prior to visit.     PAST MEDICAL HISTORY: Past Medical History:  Diagnosis Date  . Double vision   . Medical history non-contributory   . Migraines   . Numbness   . Severe menstrual cramps   . Weakness of hand     PAST SURGICAL HISTORY: Past Surgical History:  Procedure Laterality Date  . BREAST LUMPECTOMY    . DILATION AND CURETTAGE OF UTERUS    . LAPAROSCOPIC TUBAL LIGATION Bilateral 08/05/2013   Procedure: LAPAROSCOPIC TUBAL LIGATION;  Surgeon: Antionette CharLisa Jackson-Moore, MD;  Location: [facility];  Service: Gynecology;  Laterality: Bilateral;  . rods in feet    . ROTATOR CUFF REPAIR      FAMILY HISTORY: Family History  Problem Relation Age of Onset  . Stroke Mother   . Abdominal Wall Hernia Mother   . Hypertension Mother   . Hypertension Father   . Heart disease Father        pacemaker  . Diabetes Sister   . Multiple  sclerosis Sister   . Liver cancer Maternal Grandmother   . Ovarian cancer Maternal Grandmother   . Pancreatic cancer Paternal Grandmother     SOCIAL HISTORY: Social History   Social History  . Marital status: Single    Spouse name: N/A  . Number of children: 3  . Years of education: college   Occupational History  . Homemaker    Social History Main Topics  . Smoking status: Former Smoker    Packs/day: 0.25    Types: Cigarettes    Quit date: 02/17/2017  . Smokeless tobacco: Never Used  . Alcohol use No  . Drug use: No  . Sexual activity: Yes    Partners: Male    Birth control/ protection: Surgical   Other Topics Concern  . Not on file   Social History Narrative   Patient lives at home with her three children.    Patient is a homemaker.   Education college education.   Right handed.    2 cups coffee, 1 bottle of Anheuser-BuschMountain Dew daily.        PHYSICAL EXAM  Vitals:   03/04/17 1304  BP: (!) 138/94  Pulse: 80  Weight: 142 lb 6.4 oz (64.6 kg)  Height: 5\' 10"  (1.778 m)   Body mass index is 20.43 kg/m.  Generalized: Well developed, in no acute distress  Head: normocephalic and atraumatic,. Oropharynx benign  Neck: Supple, no carotid bruits  Cardiac: Regular rate rhythm, no murmur  Musculoskeletal: No deformity   Neurological examination   Mentation: Alert oriented to time, place, history taking. Attention span and concentration appropriate. Recent and remote memory intact.  Follows all commands speech and language fluent.   Cranial nerve II-XII: Fundoscopic exam reveals sharp disc margins.Pupils were equal round reactive to light extraocular movements were full, visual field were full on confrontational test. Facial sensation and strength were normal. hearing was intact to finger rubbing bilaterally. Uvula tongue midline. head turning and shoulder shrug were normal and symmetric.Tongue protrusion into cheek strength was normal. Motor: normal bulk and tone, full  strength in the BUE, BLE, fine finger movements normal, no pronator drift. No focal weakness Sensory: normal and symmetric to light touch, pinprick, and  Vibration,In the upper and lower extremities Coordination: finger-nose-finger, heel-to-shin bilaterally, no dysmetria Reflexes: Brachioradialis 2/2, biceps 2/2, triceps 2/2, patellar 2/2, Achilles 2/2, plantar responses were flexor bilaterally. Gait and Station: Rising up from seated position without assistance, normal stance,  moderate stride, good arm swing, smooth turning, able to perform tiptoe, and heel walking without difficulty. Tandem gait is steady  DIAGNOSTIC DATA (LABS, IMAGING, TESTING) - I reviewed patient records, labs, notes, testing and imaging myself where available.    ASSESSMENT AND PLAN  36 y.o. year old female  has a past medical history of Chronic migraine with aura with recent ER visit for headache lasting 3 days here to follow-up   PLAN: Continue propanolol 80 mg twice daily migraine preventive Continue Maxalt acutely  given list of migraine triggers and reviewed these Migraine tracker APP to record   migraines   call for worsening of headaches  Follow-up in 6-8 months I spent 25 min in total face to face time with the patient more than 50% of which was spent counseling and coordination of care, reviewing test results reviewing medications and discussing and reviewing the diagnosis of migraine and voiding migraine triggers. In terms of list of foods that were given to her eliminate one time if it is problematic, also important to keep a record of your headaches and bring to your visit Nilda Riggs, [facility], [facility], APRN  [facility] [address] [place], [place] [address] [phone]

## 2017-03-04 NOTE — Patient Instructions (Signed)
Continue propanolol 80 mg twice daily migraine preventive Continue Maxalt acutely  given list of migraine triggers and reviewed these Migraine tracker APP to record  Of migraines   call for worsening of headaches  Follow-up in 6-8 months

## 2017-03-10 ENCOUNTER — Telehealth: Payer: Self-pay | Admitting: Nurse Practitioner

## 2017-03-10 NOTE — Telephone Encounter (Signed)
Von[facility] [phone] called to advise the pt is not taking propranolol (INDERAL) 80 MG tablet and amLODipine (NORVASC) 5 MG tablet . She said the pt does not believe in efficacy of propranolol. FYI

## 2017-03-10 NOTE — Telephone Encounter (Addendum)
Spoke to Von.  Pt had relayed that she was not taking propranolol as was not effective.  I told her that we saw pt on 03-04-17 and she was taking.  From there records she had not picked up from pharmacy.  [facility] and pt filled once 01-08-17 for 60 tabs and has not refilled since.   I relayed that norvasc is not medication that we prescribe.  She wanted to let us know.

## 2017-08-24 NOTE — Progress Notes (Deleted)
[facility]  PATIENT: Shirley [name] DOB: Aug 27, 1981   REASON FOR VISIT: Follow-up for migraine headaches ER visit  02/22/17 HISTORY FROM: Patient    HISTORY OF PRESENT ILLNESS:[name] is a 36 years old right-handed female, came in for continued migraine,  I saw her previously last visit was in January 2016. Initial visit was in 2015, she was referred by emergency room for evaluation of her complains, she does not have primary care physician  Around September 08 2014, she had developed loss of peripheral vision, seeing spots in her visual field, lasting 30 minutes, followed by severe holo-cranial pounding headaches, with associated light noise sensitivity, she slept for couple hours, headache has improved,  She drove to [facility] time, after driving, she noticed right leg numbness tingling, traveling from right groin area, posterior right thigh, right lateral leg, involving right foot, top, and the bottom  Her right leg numbness, mild weakness has been persistent since its onset, she also noticed intermittent right hand numbness, weakness, difficulty opening a door knob, lasting for 3 days, in early December, also reported episode of slurred speech, blank stare, lasting for few seconds,   She presented to emergency room September 22 2014, CT head was normal  Labs, mild anemia, hemoglobin 11.7, otherwise normal CBC, CMP,  She now continue has right leg numbness, subjective weakness of right hand, right leg, denies visual loss   UPDATE Jan 5th 2016:YY MRI of the brain was normal  She has worsening right leg numbness, also has more right arm numbness extending from right elbow to her right fingers, she denies significant weakness She has frequent headaches daily mild to moderate pressure holoacranial headaches, tinnitus of bilateral ear,  Update January 08 2017:YY She has lost follow-up since last visit in January 2016, because her  headache is doing so well, she only has occasionally recurrence, but since summer of 2017, she began to have frequent headaches again, she reported one episode she had such severe headache she passed out.  She reported gradually increased frequency and severity of the headache, now she has headaches 2-3 times each week, lateralized severe pounding headache with associated light noise sensitivity, blurry vision, movement made it worse, nauseous. She has tried ibuprofen 800 mg with limited help, was given the prescription of Imitrex 50 mg as needed since March 2018, she tried more than 5 times without help at all.  She also described visual distortion, flashing light in peripheral visual field before and during severe headaches. We have personally reviewed MRI of the brain in 2015 there was no significant abnormality, MRI cervical spine January 2016, mild degenerative changessignificant canal foraminal stenosis.  UPDATE 05/16/2018CM Ms. Smolinski, 36 year old female returns for follow-up with history of migraine headaches. She went to the ER on 02/22/17 and received migraine cocktail for a headache that is been going on for 3 days. Her headaches are severe pounding associated with light noise sensitivity blurry vision , nausea and movement makes it worse.  She is currently on Inderal 80 mg twice daily as preventive and Imitrex acutely. She is not aware of any migraine triggers. Except her cycles she may have more headaches. She returns for reevaluation   REVIEW OF SYSTEMS: Full 14 system review of systems performed and notable only for those listed, all others are neg:  Constitutional: neg  Cardiovascular: neg Ear/Nose/Throat: neg  Skin: neg Eyes: Light sensitivity Respiratory: neg Gastroitestinal: neg  Hematology/Lymphatic: neg  Endocrine: neg Musculoskeletal:neg Allergy/Immunology: neg Neurological: Migraine headache Psychiatric: neg  Sleep : neg   ALLERGIES: No Known Allergies  HOME  MEDICATIONS: Outpatient Medications Prior to Visit  Medication Sig Dispense Refill  . amLODipine (NORVASC) 5 MG tablet Take 1 tablet (5 mg total) by mouth daily. 30 tablet 0  . HYDROmorphone (DILAUDID) 4 MG tablet Take 1 tablet (4 mg total) by mouth every 6 (six) hours as needed for moderate pain or severe pain. 20 tablet 0  . ibuprofen (ADVIL,MOTRIN) 800 MG tablet Take 1 tablet (800 mg total) by mouth every 8 (eight) hours as needed. 30 tablet 5  . ondansetron (ZOFRAN ODT) 4 MG disintegrating tablet Take 1 tablet (4 mg total) by mouth every 8 (eight) hours as needed. 30 tablet 6  . propranolol (INDERAL) 80 MG tablet Take 1 tablet (80 mg total) by mouth 2 (two) times daily. 60 tablet 11  . rizatriptan (MAXALT-MLT) 10 MG disintegrating tablet Take 1 tablet (10 mg total) by mouth as needed. May repeat in 2 hours if needed 15 tablet 6  . SUMAtriptan (IMITREX) 50 MG tablet Take 1 tablet (50 mg total) by mouth every 2 (two) hours as needed for migraine or headache. May repeat in 2 hours if headache persists or recurs. 15 tablet 6   No facility-administered medications prior to visit.     PAST MEDICAL HISTORY: Past Medical History:  Diagnosis Date  . Double vision   . Medical history non-contributory   . Migraines   . Numbness   . Severe menstrual cramps   . Weakness of hand     PAST SURGICAL HISTORY: Past Surgical History:  Procedure Laterality Date  . BREAST LUMPECTOMY    . DILATION AND CURETTAGE OF UTERUS    . rods in feet    . ROTATOR CUFF REPAIR      FAMILY HISTORY: Family History  Problem Relation Age of Onset  . Stroke Mother   . Abdominal Wall Hernia Mother   . Hypertension Mother   . Hypertension Father   . Heart disease Father        pacemaker  . Diabetes Sister   . Multiple sclerosis Sister   . Liver cancer Maternal Grandmother   . Ovarian cancer Maternal Grandmother   . Pancreatic cancer Paternal Grandmother     SOCIAL HISTORY: Social History   Socioeconomic  History  . Marital status: Single    Spouse name: Not on file  . Number of children: 3  . Years of education: college  . Highest education level: Not on file  Social Needs  . Financial resource strain: Not on file  . Food insecurity - worry: Not on file  . Food insecurity - inability: Not on file  . Transportation needs - medical: Not on file  . Transportation needs - non-medical: Not on file  Occupational History  . Occupation: Homemaker  Tobacco Use  . Smoking status: Former Smoker    Packs/day: 0.25    Types: Cigarettes    Last attempt to quit: 02/17/2017    Years since quitting: 0.5  . Smokeless tobacco: Never Used  Substance and Sexual Activity  . Alcohol use: No    Alcohol/week: 0.0 oz  . Drug use: No  . Sexual activity: Yes    Partners: Male    Birth control/protection: Surgical  Other Topics Concern  . Not on file  Social History Narrative   Patient lives at home with her three children.    Patient is a homemaker.   Education college education.   Right handed.  2 cups coffee, 1 bottle of [employer]he[employer]ountain Dew daily.     PHYSICAL EXAM  There were no vitals filed for this visit. There is no height or weight on file to calculate BMI.  Generalized: Well developed, in no acute distress  Head: normocephalic and atraumatic,. Oropharynx benign  Neck: Supple, no carotid bruits  Cardiac: Regular rate rhythm, no murmur  Musculoskeletal: No deformity   Neurological examination   Mentation: Alert oriented to time, place, history taking. Attention span and concentration appropriate. Recent and remote memory intact.  Follows all commands speech and language fluent.   Cranial nerve II-XII: Fundoscopic exam reveals sharp disc margins.Pupils were equal round reactive to light extraocular movements were full, visual field were full on confrontational test. Facial sensation and strength were normal. hearing was intact to finger rubbing bilaterally. Uvula tongue midline. head turning  and shoulder shrug were normal and symmetric.Tongue protrusion into cheek strength was normal. Motor: normal bulk and tone, full strength in the BUE, BLE, fine finger movements normal, no pronator drift. No focal weakness Sensory: normal and symmetric to light touch, pinprick, and  Vibration,In the upper and lower extremities Coordination: finger-nose-finger, heel-to-shin bilaterally, no dysmetria Reflexes: Brachioradialis 2/2, biceps 2/2, triceps 2/2, patellar 2/2, Achilles 2/2, plantar responses were flexor bilaterally. Gait and Station: Rising up from seated position without assistance, normal stance,  moderate stride, good arm swing, smooth turning, able to perform tiptoe, and heel walking without difficulty. Tandem gait is steady  DIAGNOSTIC DATA (LABS, IMAGING, TESTING) - I reviewed patient records, labs, notes, testing and imaging myself where available.    ASSESSMENT AND PLAN  36 y.o. year old female  has a past medical history of Chronic migraine with aura with recent ER visit for headache lasting 3 days here to follow-up   PLAN: Continue propanolol 80 mg twice daily migraine preventive Continue Maxalt acutely  given list of migraine triggers and reviewed these Migraine tracker APP to record   migraines   call for worsening of headaches  Follow-up in 6-8 months I spent 25 min in total face to face time with the patient more than 50% of which was spent counseling and coordination of care, reviewing test results reviewing medications and discussing and reviewing the diagnosis of migraine and voiding migraine triggers. In terms of list of foods that were given to her eliminate one time if it is problematic, also important to keep a record of your headaches and bring to your visit Nilda RiggsNancy Carolyn Roshunda Keir, [facility] HospitalGNP, [facility], APRN  [facility] [address], [address] [place], [place] [address] [phone]

## 2017-08-25 ENCOUNTER — Ambulatory Visit: Payer: Medicaid Other | Admitting: Nurse Practitioner

## 2017-08-25 ENCOUNTER — Telehealth: Payer: Self-pay | Admitting: *Deleted

## 2017-08-25 NOTE — Telephone Encounter (Signed)
Patient was no show for follow up today with NP. 

## 2017-08-26 ENCOUNTER — Encounter: Payer: Self-pay | Admitting: Nurse Practitioner

## 2017-09-04 ENCOUNTER — Ambulatory Visit: Payer: Medicaid Other | Admitting: Nurse Practitioner

## 2017-12-03 ENCOUNTER — Encounter: Payer: Self-pay | Admitting: Obstetrics

## 2017-12-03 ENCOUNTER — Ambulatory Visit (INDEPENDENT_AMBULATORY_CARE_PROVIDER_SITE_OTHER): Payer: Medicaid Other | Admitting: Obstetrics

## 2017-12-03 ENCOUNTER — Other Ambulatory Visit (HOSPITAL_COMMUNITY)
Admission: RE | Admit: 2017-12-03 | Discharge: 2017-12-03 | Disposition: A | Discharge status: Home / Self Care | Payer: Medicaid Other | Source: Ambulatory Visit | Attending: Obstetrics | Admitting: Obstetrics

## 2017-12-03 VITALS — BP 160/112 | HR 90 | Wt 140.0 lb

## 2017-12-03 DIAGNOSIS — N898 Other specified noninflammatory disorders of vagina: Secondary | ICD-10-CM | POA: Diagnosis not present

## 2017-12-03 DIAGNOSIS — N946 Dysmenorrhea, unspecified: Secondary | ICD-10-CM

## 2017-12-03 DIAGNOSIS — Z30432 Encounter for removal of intrauterine contraceptive device: Secondary | ICD-10-CM

## 2017-12-03 DIAGNOSIS — T8332XA Displacement of intrauterine contraceptive device, initial encounter: Secondary | ICD-10-CM | POA: Diagnosis not present

## 2017-12-03 DIAGNOSIS — N939 Abnormal uterine and vaginal bleeding, unspecified: Secondary | ICD-10-CM | POA: Diagnosis not present

## 2017-12-03 DIAGNOSIS — I10 Essential (primary) hypertension: Secondary | ICD-10-CM | POA: Diagnosis not present

## 2017-12-03 MED ORDER — IBUPROFEN 800 MG PO TABS: 800.0000 mg | ORAL_TABLET | Freq: Three times a day (TID) | ORAL | 5 refills | Status: DC | PRN

## 2017-12-03 MED ORDER — TRIAMTERENE-HCTZ 37.5-25 MG PO CAPS: 1.0000 | ORAL_CAPSULE | Freq: Every day | ORAL | 11 refills | Status: DC

## 2017-12-03 MED ORDER — AMLODIPINE BESYLATE 5 MG PO TABS: 5.0000 mg | ORAL_TABLET | Freq: Every day | ORAL | 11 refills | Status: DC

## 2017-12-03 MED ORDER — NORETHINDRONE 0.35 MG PO TABS: 1.0000 | ORAL_TABLET | Freq: Every day | ORAL | 11 refills | Status: DC

## 2017-12-03 MED ORDER — CARVEDILOL 12.5 MG PO TABS: 12.5000 mg | ORAL_TABLET | Freq: Two times a day (BID) | ORAL | 11 refills | Status: DC

## 2017-12-03 NOTE — Progress Notes (Signed)
RGYN pt presents for IUD Removal  Due to prolonged periods  Pt has no other concerns.   Last pap: 07/09/2016

## 2017-12-03 NOTE — Progress Notes (Signed)
Subjective:    Shirley [name] is a 37 y.o. female who presents for contraception counseling. The patient has no complaints today. The patient is sexually active. Pertinent past medical history: hypertension.  The information documented in the HPI was reviewed and verified.  Menstrual History: OB History    Gravida Para Term Preterm AB Living   5 3 3   2 3    SAB TAB Ectopic Multiple Live Births   1 0     3       Patient's last menstrual period was 11/07/2017 (exact date).   Patient Active Problem List   Diagnosis Date Noted  . Slurred speech 09/28/2014  . Migraine 09/28/2014  . Weakness of hand   . Unspecified symptom associated with female genital organs 03/07/2013  . Allergic rhinitis, seasonal 03/07/2013  . GERD (gastroesophageal reflux disease) 03/07/2013   Past Medical History:  Diagnosis Date  . Double vision   . Medical history non-contributory   . Migraines   . Numbness   . Severe menstrual cramps   . Weakness of hand     Past Surgical History:  Procedure Laterality Date  . BREAST LUMPECTOMY    . DILATION AND CURETTAGE OF UTERUS    . LAPAROSCOPIC TUBAL LIGATION Bilateral 08/05/2013   Procedure: LAPAROSCOPIC TUBAL LIGATION;  Surgeon: Antionette CharLisa Jackson-Moore, MD;  Location: [facility];  Service: Gynecology;  Laterality: Bilateral;  . rods in feet    . ROTATOR CUFF REPAIR       Current Outpatient Medications:  .  amLODipine (NORVASC) 5 MG tablet, Take 1 tablet (5 mg total) by mouth daily., Disp: 30 tablet, Rfl: 11 .  ibuprofen (ADVIL,MOTRIN) 800 MG tablet, Take 1 tablet (800 mg total) by mouth every 8 (eight) hours as needed., Disp: 30 tablet, Rfl: 5 .  carvedilol (COREG) 12.5 MG tablet, Take 1 tablet (12.5 mg total) by mouth 2 (two) times daily with a meal., Disp: 60 tablet, Rfl: 11 .  HYDROmorphone (DILAUDID) 4 MG tablet, Take 1 tablet (4 mg total) by mouth every 6 (six) hours as needed for moderate pain or severe pain. (Patient not taking: Reported on 12/03/2017), Disp:  20 tablet, Rfl: 0 .  norethindrone (MICRONOR,CAMILA,ERRIN) 0.35 MG tablet, Take 1 tablet (0.35 mg total) by mouth daily., Disp: 1 Package, Rfl: 11 .  ondansetron (ZOFRAN ODT) 4 MG disintegrating tablet, Take 1 tablet (4 mg total) by mouth every 8 (eight) hours as needed. (Patient not taking: Reported on 12/03/2017), Disp: 30 tablet, Rfl: 6 .  propranolol (INDERAL) 80 MG tablet, Take 1 tablet (80 mg total) by mouth 2 (two) times daily. (Patient not taking: Reported on 12/03/2017), Disp: 60 tablet, Rfl: 11 .  rizatriptan (MAXALT-MLT) 10 MG disintegrating tablet, Take 1 tablet (10 mg total) by mouth as needed. May repeat in 2 hours if needed (Patient not taking: Reported on 12/03/2017), Disp: 15 tablet, Rfl: 6 .  SUMAtriptan (IMITREX) 50 MG tablet, Take 1 tablet (50 mg total) by mouth every 2 (two) hours as needed for migraine or headache. May repeat in 2 hours if headache persists or recurs. (Patient not taking: Reported on 12/03/2017), Disp: 15 tablet, Rfl: 6 .  triamterene-hydrochlorothiazide (DYAZIDE) 37.5-25 MG capsule, Take 1 each (1 capsule total) by mouth daily., Disp: 30 capsule, Rfl: 11 No Known Allergies  Social History   Tobacco Use  . Smoking status: Former Smoker    Packs/day: 0.25    Types: Cigarettes    Last attempt to quit: 02/17/2017    Years since  quitting: 0.7  . Smokeless tobacco: Never Used  Substance Use Topics  . Alcohol use: No    Alcohol/week: 0.0 oz    Family History  Problem Relation Age of Onset  . Stroke Mother   . Abdominal Wall Hernia Mother   . Hypertension Mother   . Hypertension Father   . Heart disease Father        pacemaker  . Diabetes Sister   . Multiple sclerosis Sister   . Liver cancer Maternal Grandmother   . Ovarian cancer Maternal Grandmother   . Pancreatic cancer Paternal Grandmother        Review of Systems Constitutional: negative for weight loss Genitourinary:negative for abnormal menstrual periods and vaginal discharge   Objective:    BP (!) 160/112   Pulse 90   Wt 140 lb (63.5 kg)   LMP 11/07/2017 (Exact Date)   BMI 20.09 kg/m    General:   alert  Skin:   no rash or abnormalities  Lungs:   clear to auscultation bilaterally  Heart:   regular rate and rhythm, S1, S2 normal, no murmur, click, rub or gallop  Breasts:   normal without suspicious masses, skin or nipple changes or axillary nodes  Abdomen:  normal findings: no organomegaly, soft, non-tender and no hernia  Pelvis:  External genitalia: normal general appearance Urinary system: urethral meatus normal and bladder without fullness, nontender Vaginal: normal without tenderness, induration or masses Cervix: normal appearance.  IUD protruding through os. Removed.( see IUD Removal note ) Adnexa: normal bimanual exam Uterus: anteverted and non-tender, normal size   Lab Review Urine pregnancy test Labs reviewed yes Radiologic studies reviewed no  50% of 20 min visit spent on counseling and coordination of care.    Assessment:    37 y.o., discontinuing IUD because of AUB and malpositioned IUD, no contraindications.   Plan:    All questions answered. Discussed healthy lifestyle modifications. Follow up in 3 weeks. Annual and Pap   Meds ordered this encounter  Medications  . norethindrone (MICRONOR,CAMILA,ERRIN) 0.35 MG tablet    Sig: Take 1 tablet (0.35 mg total) by mouth daily.    Dispense:  1 Package    Refill:  11  . ibuprofen (ADVIL,MOTRIN) 800 MG tablet    Sig: Take 1 tablet (800 mg total) by mouth every 8 (eight) hours as needed.    Dispense:  30 tablet    Refill:  5  . DISCONTD: amLODipine (NORVASC) 5 MG tablet    Sig: Take 1 tablet (5 mg total) by mouth daily.    Dispense:  30 tablet    Refill:  11  . triamterene-hydrochlorothiazide (DYAZIDE) 37.5-25 MG capsule    Sig: Take 1 each (1 capsule total) by mouth daily.    Dispense:  30 capsule    Refill:  11  . carvedilol (COREG) 12.5 MG tablet    Sig: Take 1 tablet (12.5 mg total) by  mouth 2 (two) times daily with a meal.    Dispense:  60 tablet    Refill:  11  . amLODipine (NORVASC) 5 MG tablet    Sig: Take 1 tablet (5 mg total) by mouth daily.    Dispense:  30 tablet    Refill:  11   No orders of the defined types were placed in this encounter.   Brock Bad MD      GYNECOLOGY OFFICE PROCEDURE NOTE  Shirley [name] is a 37 y.o. [phone] here for Liletta IUD removal because of AUB  Last pap smear was in 2017 and was normal.  IUD Removal  Patient identified, informed consent performed, consent signed.  Patient was in the dorsal lithotomy position, normal external genitalia was noted.  A speculum was placed in the patient's vagina, normal discharge was noted, no lesions. The cervix was visualized, no lesions, no abnormal discharge.  A pole of the IUD was protruding through the os of cervix.  The IUD was grasped and pulled using dressing forceps. The IUD was removed in its entirety.   Patient tolerated the procedure well.    Patient will use tubal ligation for contraception.  She was using the IUD for AUB and severe dysmenorrhea.  Routine preventative health maintenance measures emphasized.   Brock Bad, MD, FACOG Obstetrician & Gynecologist, [facility] for [employer] at [place], [facility]

## 2017-12-04 LAB — CERVICOVAGINAL ANCILLARY ONLY
BACTERIAL VAGINITIS: POSITIVE — AB
Candida vaginitis: NEGATIVE
Chlamydia: NEGATIVE
NEISSERIA GONORRHEA: NEGATIVE
TRICH (WINDOWPATH): NEGATIVE

## 2017-12-05 ENCOUNTER — Other Ambulatory Visit: Payer: Self-pay | Admitting: Obstetrics

## 2017-12-05 DIAGNOSIS — N760 Acute vaginitis: Principal | ICD-10-CM

## 2017-12-05 DIAGNOSIS — B9689 Other specified bacterial agents as the cause of diseases classified elsewhere: Secondary | ICD-10-CM

## 2017-12-05 MED ORDER — SECNIDAZOLE 2 G PO PACK: 1.0000 | PACK | Freq: Once | ORAL | 2 refills | Status: AC

## 2017-12-11 ENCOUNTER — Encounter: Payer: Self-pay | Admitting: *Deleted

## 2017-12-14 ENCOUNTER — Telehealth: Payer: Self-pay

## 2017-12-14 NOTE — Telephone Encounter (Signed)
ZO#[id number] Effective 12/14/17-12/09/18 [employer]

## 2017-12-22 ENCOUNTER — Ambulatory Visit: Payer: Medicaid Other | Admitting: Obstetrics

## 2018-04-03 ENCOUNTER — Emergency Department (HOSPITAL_BASED_OUTPATIENT_CLINIC_OR_DEPARTMENT_OTHER)
Admission: EM | Admit: 2018-04-03 | Discharge: 2018-04-03 | Disposition: A | Discharge status: Home / Self Care | Payer: Medicaid Other | Attending: Emergency Medicine | Admitting: Emergency Medicine

## 2018-04-03 ENCOUNTER — Encounter (HOSPITAL_BASED_OUTPATIENT_CLINIC_OR_DEPARTMENT_OTHER): Payer: Self-pay | Admitting: Emergency Medicine

## 2018-04-03 ENCOUNTER — Ambulatory Visit (HOSPITAL_BASED_OUTPATIENT_CLINIC_OR_DEPARTMENT_OTHER)
Admit: 2018-04-03 | Discharge: 2018-04-03 | Disposition: A | Discharge status: Home / Self Care | Payer: Medicaid Other | Attending: Emergency Medicine | Admitting: Emergency Medicine

## 2018-04-03 ENCOUNTER — Other Ambulatory Visit: Payer: Self-pay

## 2018-04-03 DIAGNOSIS — R1031 Right lower quadrant pain: Secondary | ICD-10-CM | POA: Insufficient documentation

## 2018-04-03 DIAGNOSIS — N760 Acute vaginitis: Secondary | ICD-10-CM | POA: Diagnosis not present

## 2018-04-03 DIAGNOSIS — Z79899 Other long term (current) drug therapy: Secondary | ICD-10-CM | POA: Insufficient documentation

## 2018-04-03 DIAGNOSIS — Z87891 Personal history of nicotine dependence: Secondary | ICD-10-CM | POA: Diagnosis not present

## 2018-04-03 DIAGNOSIS — B9689 Other specified bacterial agents as the cause of diseases classified elsewhere: Secondary | ICD-10-CM

## 2018-04-03 DIAGNOSIS — R102 Pelvic and perineal pain: Secondary | ICD-10-CM | POA: Insufficient documentation

## 2018-04-03 LAB — URINALYSIS, ROUTINE W REFLEX MICROSCOPIC
Bilirubin Urine: NEGATIVE
Glucose, UA: NEGATIVE mg/dL
Hgb urine dipstick: NEGATIVE
Ketones, ur: NEGATIVE mg/dL
Leukocytes, UA: NEGATIVE
NITRITE: POSITIVE — AB
Protein, ur: NEGATIVE mg/dL
SPECIFIC GRAVITY, URINE: 1.025 (ref 1.005–1.030)
pH: 6 (ref 5.0–8.0)

## 2018-04-03 LAB — URINALYSIS, MICROSCOPIC (REFLEX)

## 2018-04-03 LAB — WET PREP, GENITAL
Trich, Wet Prep: NONE SEEN
Yeast Wet Prep HPF POC: NONE SEEN

## 2018-04-03 LAB — PREGNANCY, URINE: PREG TEST UR: NEGATIVE

## 2018-04-03 MED ORDER — METRONIDAZOLE 500 MG PO TABS: 500.0000 mg | ORAL_TABLET | Freq: Two times a day (BID) | ORAL | 0 refills | Status: DC

## 2018-04-03 NOTE — ED Notes (Signed)
ED Provider at bedside. 

## 2018-04-03 NOTE — ED Provider Notes (Signed)
[facility] Provider Note: [name], MD, FACEP  CSN: [id number] MRN: [id number] ARRIVAL: 04/03/18 at 0125 ROOM: MH01/MH01   CHIEF COMPLAINT  Abdominal Pain   HISTORY OF PRESENT ILLNESS  04/03/18 2:05 AM Shirley [name] is a 37 y.o. female who complains of right lower quadrant abdominal pain since June 4.  She thought this was associated with her menses, which lasted 3 days, but the pain is persisted since her menses ended.  She describes the pain in her right lower quadrant as sharp and crampy.  It is intermittent.  It radiates to the right flank with a burning sensation.  It is somewhat worse with movement or palpation.  She denies associated fever, chills, nausea, vomiting, diarrhea, dysuria, vaginal bleeding or vaginal discharge.  She did have some nausea and vomiting at the beginning of her period but that resolved.  She rates her pain as a 6 out of 10 at its worst.   Past Medical History:  Diagnosis Date  . Double vision   . Medical history non-contributory   . Migraines   . Numbness   . Severe menstrual cramps   . Weakness of hand     Past Surgical History:  Procedure Laterality Date  . BREAST LUMPECTOMY    . DILATION AND CURETTAGE OF UTERUS    . LAPAROSCOPIC TUBAL LIGATION Bilateral 08/05/2013   Procedure: LAPAROSCOPIC TUBAL LIGATION;  Surgeon: Antionette Char, MD;  Location: [facility];  Service: Gynecology;  Laterality: Bilateral;  . rods in feet    . ROTATOR CUFF REPAIR      Family History  Problem Relation Age of Onset  . Stroke Mother   . Abdominal Wall Hernia Mother   . Hypertension Mother   . Hypertension Father   . Heart disease Father        pacemaker  . Diabetes Sister   . Multiple sclerosis Sister   . Liver cancer Maternal Grandmother   . Ovarian cancer Maternal Grandmother   . Pancreatic cancer Paternal Grandmother     Social History   Tobacco Use  . Smoking status: Former Smoker    Packs/day: 0.25    Types: Cigarettes   Last attempt to quit: 02/17/2017    Years since quitting: 1.1  . Smokeless tobacco: Never Used  Substance Use Topics  . Alcohol use: No    Alcohol/week: 0.0 oz  . Drug use: No    Prior to Admission medications   Medication Sig Start Date End Date Taking? Authorizing Provider  amLODipine (NORVASC) 5 MG tablet Take 1 tablet (5 mg total) by mouth daily. 12/03/17   Brock Bad, MD  carvedilol (COREG) 12.5 MG tablet Take 1 tablet (12.5 mg total) by mouth 2 (two) times daily with a meal. 12/03/17   Brock Bad, MD  HYDROmorphone (DILAUDID) 4 MG tablet Take 1 tablet (4 mg total) by mouth every 6 (six) hours as needed for moderate pain or severe pain. Patient not taking: Reported on 12/03/2017 12/11/16   Brock Bad, MD  ibuprofen (ADVIL,MOTRIN) 800 MG tablet Take 1 tablet (800 mg total) by mouth every 8 (eight) hours as needed. 12/03/17   Brock Bad, MD  norethindrone (MICRONOR,CAMILA,ERRIN) 0.35 MG tablet Take 1 tablet (0.35 mg total) by mouth daily. 12/03/17   Brock Bad, MD  ondansetron (ZOFRAN ODT) 4 MG disintegrating tablet Take 1 tablet (4 mg total) by mouth every 8 (eight) hours as needed. Patient not taking: Reported on 12/03/2017 01/08/17   Terrace Arabia,  Vivia EwingYijun, MD  propranolol (INDERAL) 80 MG tablet Take 1 tablet (80 mg total) by mouth 2 (two) times daily. Patient not taking: Reported on 12/03/2017 01/08/17   Levert FeinsteinYan, Yijun, MD  rizatriptan (MAXALT-MLT) 10 MG disintegrating tablet Take 1 tablet (10 mg total) by mouth as needed. May repeat in 2 hours if needed Patient not taking: Reported on 12/03/2017 01/08/17   Levert FeinsteinYan, Yijun, MD  SUMAtriptan (IMITREX) 50 MG tablet Take 1 tablet (50 mg total) by mouth every 2 (two) hours as needed for migraine or headache. May repeat in 2 hours if headache persists or recurs. Patient not taking: Reported on 12/03/2017 12/11/16   Brock BadHarper, Charles A, MD  triamterene-hydrochlorothiazide (DYAZIDE) 37.5-25 MG capsule Take 1 each (1 capsule total) by mouth daily.  12/03/17   Brock BadHarper, Charles A, MD    Allergies Patient has no known allergies.   REVIEW OF SYSTEMS  Negative except as noted here or in the History of Present Illness.   PHYSICAL EXAMINATION  Initial Vital Signs Blood pressure (!) 160/89, pulse 74, temperature 98.1 F (36.7 C), temperature source Oral, resp. rate 18, height 5\' 10"  (1.778 m), weight 63.5 kg (140 lb), last menstrual period 03/29/2018, SpO2 100 %.  Examination General: Well-developed, well-nourished female in no acute distress; appearance consistent with age of record HENT: normocephalic; atraumatic Eyes: pupils equal, round and reactive to light; extraocular muscles intact Neck: supple Heart: regular rate and rhythm Lungs: clear to auscultation bilaterally Abdomen: soft; nondistended; right lower quadrant tenderness just above the inguinal fold; no masses or hepatosplenomegaly; bowel sounds present GU: No CVA tenderness; normal external genitalia; orange vaginal discharge; no cervical motion tenderness; no adnexal tenderness Extremities: No deformity; full range of motion; pulses normal Neurologic: Awake, alert and oriented; motor function intact in all extremities and symmetric; no facial droop Skin: Warm and dry Psychiatric: Normal mood and affect   RESULTS  Summary of this visit's results, reviewed by myself:   EKG Interpretation  Date/Time:    Ventricular Rate:    PR Interval:    QRS Duration:   QT Interval:    QTC Calculation:   R Axis:     Text Interpretation:        Laboratory Studies: Results for orders placed or performed during the hospital encounter of 04/03/18 (from the past 24 hour(s))  Urinalysis, Routine w reflex microscopic     Status: Abnormal   Collection Time: 04/03/18  1:49 AM  Result Value Ref Range   Color, Urine YELLOW YELLOW   APPearance HAZY (A) CLEAR   Specific Gravity, Urine 1.025 1.005 - 1.030   pH 6.0 5.0 - 8.0   Glucose, UA NEGATIVE NEGATIVE mg/dL   Hgb urine dipstick  NEGATIVE NEGATIVE   Bilirubin Urine NEGATIVE NEGATIVE   Ketones, ur NEGATIVE NEGATIVE mg/dL   Protein, ur NEGATIVE NEGATIVE mg/dL   Nitrite POSITIVE (A) NEGATIVE   Leukocytes, UA NEGATIVE NEGATIVE  Pregnancy, urine     Status: None   Collection Time: 04/03/18  1:49 AM  Result Value Ref Range   Preg Test, Ur NEGATIVE NEGATIVE  Urinalysis, Microscopic (reflex)     Status: Abnormal   Collection Time: 04/03/18  1:49 AM  Result Value Ref Range   RBC / HPF 0-5 0 - 5 RBC/hpf   WBC, UA 6-10 0 - 5 WBC/hpf   Bacteria, UA MANY (A) NONE SEEN   Squamous Epithelial / LPF 11-20 0 - 5   Mucus PRESENT   Wet prep, genital     Status:  Abnormal   Collection Time: 04/03/18  2:18 AM  Result Value Ref Range   Yeast Wet Prep HPF POC NONE SEEN NONE SEEN   Trich, Wet Prep NONE SEEN NONE SEEN   Clue Cells Wet Prep HPF POC PRESENT (A) NONE SEEN   WBC, Wet Prep HPF POC MODERATE (A) NONE SEEN   Sperm PRESENT    Imaging Studies: No results found.  ED COURSE and MDM  Nursing notes and initial vitals signs, including pulse oximetry, reviewed.  Vitals:   04/03/18 0137  BP: (!) 160/89  Pulse: 74  Resp: 18  Temp: 98.1 F (36.7 C)  TempSrc: Oral  SpO2: 100%  Weight: 63.5 kg (140 lb)  Height: 5\' 10"  (1.778 m)   2:40 AM The patient's wet prep is suggestive of bacterial vaginosis and we will treat for this.  Her urinalysis is equivocal for urinary tract infection and she is not having symptoms.  We will send her urine for culture.  We will have her follow-up later today for a pelvic ultrasound to evaluate her pelvic pain.   PROCEDURES    ED DIAGNOSES     ICD-10-CM   1. Pelvic pain in female R10.2   2. Bacterial vaginosis N76.0    B96.89        Suprina Mandeville, Jonny Ruiz, MD 04/03/18 [phone]

## 2018-04-03 NOTE — ED Notes (Signed)
Patient c/o intermittent right flank pain accompanied by urinary frequency X 10 days.

## 2018-04-03 NOTE — ED Triage Notes (Signed)
Pt c/o 6/10 right lower abd pain radiating to her back for the past 2 weeks getting worse today, denies fever,chills, nvd, no urinary symptoms.

## 2018-04-05 LAB — GC/CHLAMYDIA PROBE AMP (~~LOC~~) NOT AT ARMC
Chlamydia: NEGATIVE
NEISSERIA GONORRHEA: NEGATIVE

## 2018-04-05 LAB — URINE CULTURE

## 2018-04-06 ENCOUNTER — Telehealth: Payer: Self-pay | Admitting: *Deleted

## 2018-04-06 NOTE — Telephone Encounter (Signed)
Post ED Visit - Positive Culture Follow-up: Unsuccessful Patient Follow-up  Culture assessed and recommendations reviewed by:  []  Enzo BiNathan Batchelder, Pharm.D. []  Celedonio MiyamotoJeremy Frens, Pharm.D., BCPS AQ-ID []  Garvin FilaMike Maccia, Pharm.D., BCPS []  Georgina PillionElizabeth Martin, Pharm.D., BCPS []  WoodruffMinh Pham, VermontPharm.D., BCPS, AAHIVP []  Estella HuskMichelle Turner, Pharm.D., BCPS, AAHIVP []  Sherlynn CarbonAustin Lucas, PharmD []  Pollyann SamplesAndy Johnston, PharmD, BCPS Ladell PierBrooke Baggett, PharmD  Positive urine culture  []  Patient discharged without antimicrobial prescription and treatment is now indicated []  Organism is resistant to prescribed ED discharge antimicrobial []  Patient with positive blood cultures  If having urinary symptoms continue Metronidazole and start Cephalexin 500mg  PO BID x 7 days.  If no symptoms continue Metronidazole. Unable to contact patient after 3 attempts, letter will be sent to address on file  Lysle PearlRobertson, Cardelia Sassano Talley 04/06/2018, 10:08 AM

## 2018-04-06 NOTE — Progress Notes (Signed)
ED Antimicrobial Stewardship Positive Culture Follow Up   Shirley [name] is an 37 y.o. female who [facility] with a chief complaint of No chief complaint on file.   Recent Results (from the past 720 hour(s))  Urine culture     Status: Abnormal   Collection Time: 04/03/18  1:49 AM  Result Value Ref Range Status   Specimen Description   Final    URINE, RANDOM Performed at [facility] [place], [address] [place], [place] [address]    Special Requests   Final    NONE Performed at [facility] [place], [address] [place], [place] [address]    Culture >=100,000 COLONIES/mL ESCHERICHIA COLI (A)  Final   Report Status 04/05/2018 FINAL  Final   Organism ID, Bacteria ESCHERICHIA COLI (A)  Final      Susceptibility   Escherichia coli - MIC*    AMPICILLIN >=32 RESISTANT Resistant     CEFAZOLIN <=4 SENSITIVE Sensitive     CEFTRIAXONE <=1 SENSITIVE Sensitive     CIPROFLOXACIN <=0.25 SENSITIVE Sensitive     GENTAMICIN <=1 SENSITIVE Sensitive     IMIPENEM <=0.25 SENSITIVE Sensitive     NITROFURANTOIN <=16 SENSITIVE Sensitive     TRIMETH/SULFA <=20 SENSITIVE Sensitive     AMPICILLIN/SULBACTAM 16 INTERMEDIATE Intermediate     PIP/TAZO <=4 SENSITIVE Sensitive     Extended ESBL NEGATIVE Sensitive     * >=100,000 COLONIES/mL ESCHERICHIA COLI  Wet prep, genital     Status: Abnormal   Collection Time: 04/03/18  2:18 AM  Result Value Ref Range Status   Yeast Wet Prep HPF POC NONE SEEN NONE SEEN Final   Trich, Wet Prep NONE SEEN NONE SEEN Final   Clue Cells Wet Prep HPF POC PRESENT (A) NONE SEEN Final   WBC, Wet Prep HPF POC MODERATE (A) NONE SEEN Final   Sperm PRESENT  Final    Comment: Performed at [facility] [place], [address] [place], [place] [address]    Patient treated with metronidazole for bacterial vaginosis. UCx positive for e.coli, but may have been contaminated.  New antibiotic prescription: Recommend calling patient to see if  having any urinary symptoms: dysuria, hematuria, frequency. If no > continue metronidazole only. If yes > continue metronidazole and start cephalexin 500 mg BID x 7 days.  ED Provider: Graciella FreerLindsey Layden, PA-C   [name] A Carling Liberman 04/06/2018, 9:20 AM Infectious Diseases Pharmacist Phone# [phone]

## 2018-06-19 IMAGING — US US ART/VEN ABD/PELV/SCROTUM DOPPLER LTD
1 series · 13 of 25 positions shown · non-contrast
Comparison: None.

CLINICAL DATA: The lower quadrant pain, low back pain for 2 days

EXAM:
TRANSABDOMINAL AND TRANSVAGINAL ULTRASOUND OF PELVIS
DOPPLER ULTRASOUND OF OVARIES
TECHNIQUE: Both transabdominal and transvaginal ultrasound examinations of the
pelvis were performed. Transabdominal technique was performed for
global imaging of the pelvis including uterus, ovaries, adnexal
regions, and pelvic cul-de-sac.
It was necessary to proceed with endovaginal exam following the
transabdominal exam to visualize the endometrium and ovaries. Color
and duplex Doppler ultrasound was utilized to evaluate blood flow to
the ovaries.

[Series 1: us art/ven abd/pelv/scrotum doppler ltd · 0.17mm/px · 13 of 87 slices shown]
[im 1/87]
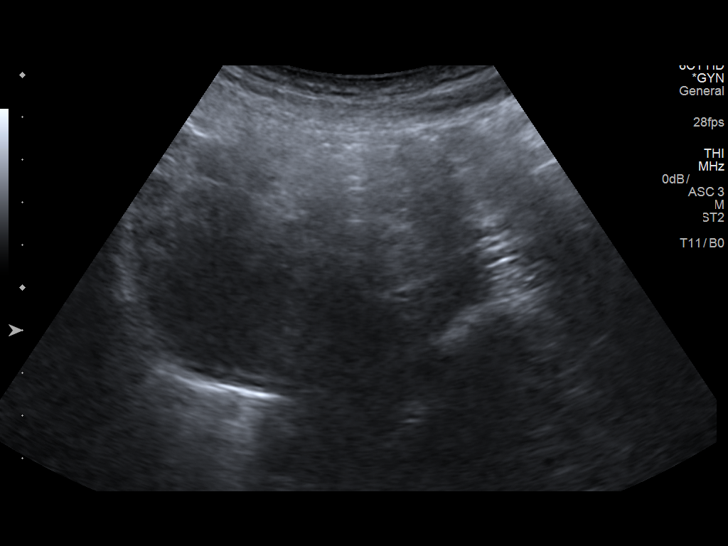
[im 8/87]
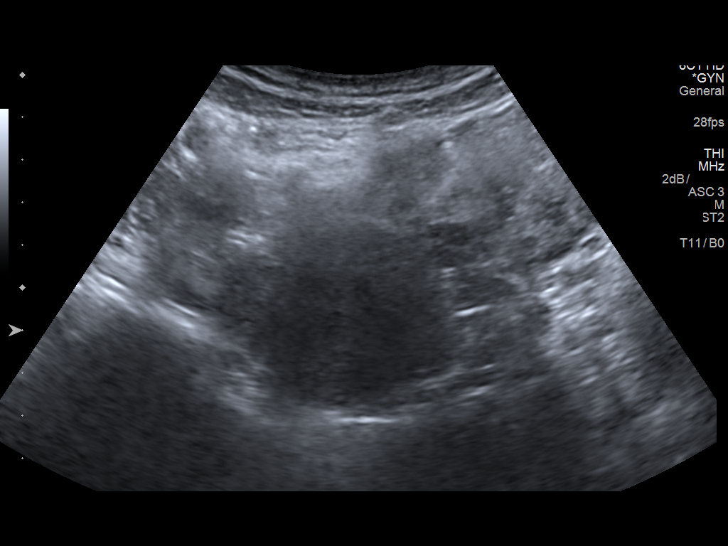
[im 15/87]
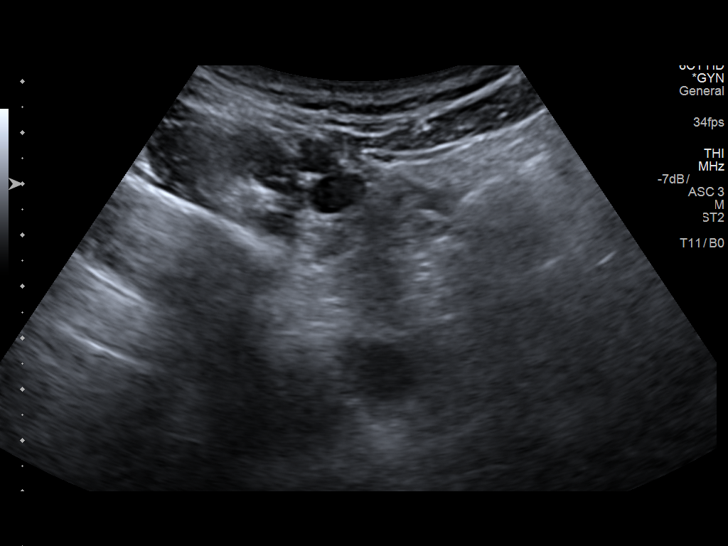
[im 22/87]
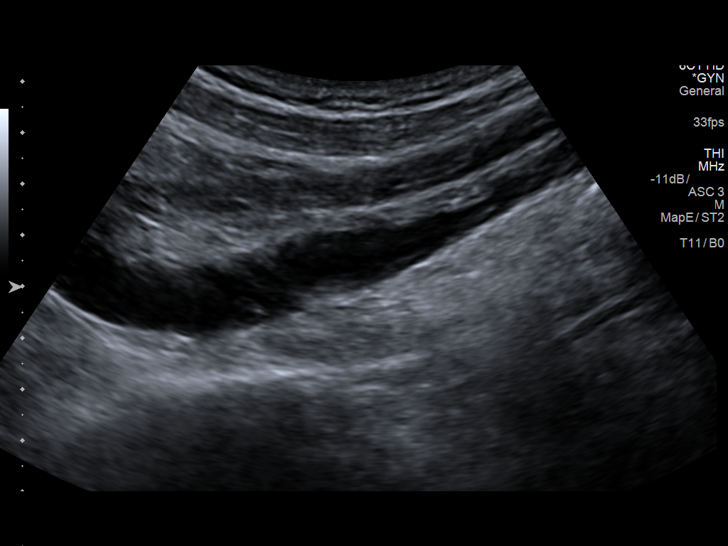
[im 29/87]
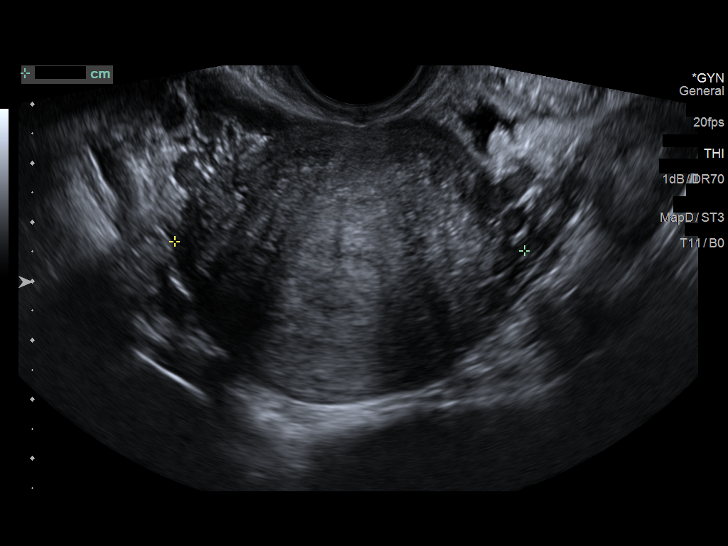
[im 36/87]
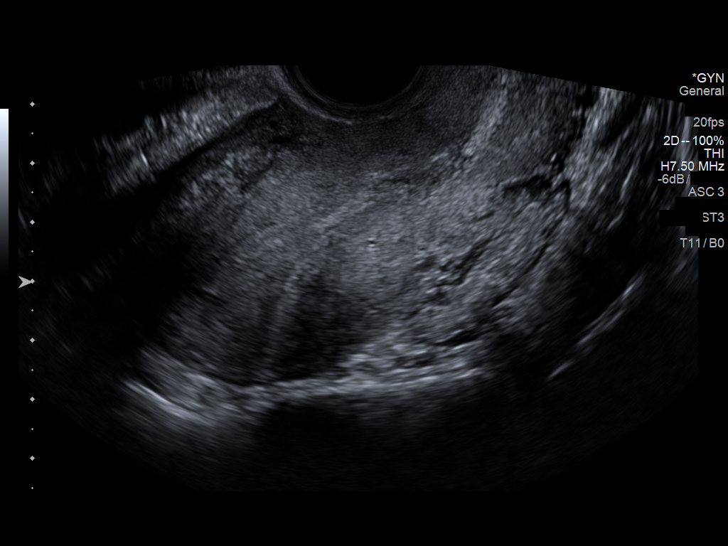
[im 44/87]
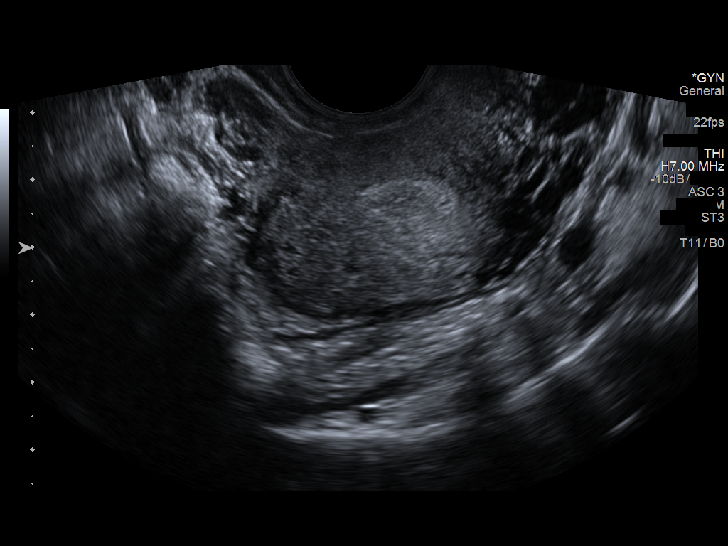
[im 51/87]
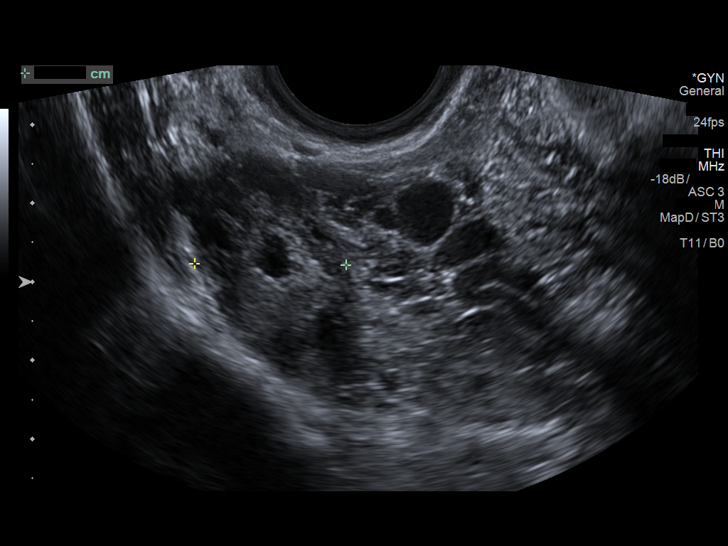
[im 58/87]
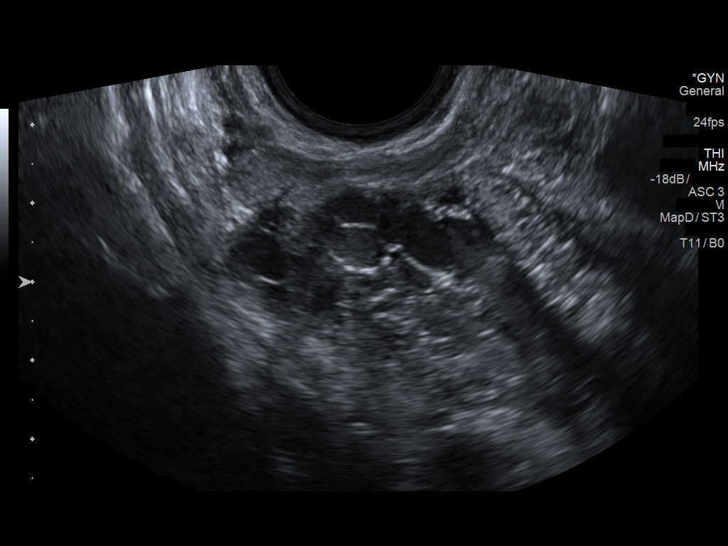
[im 65/87]
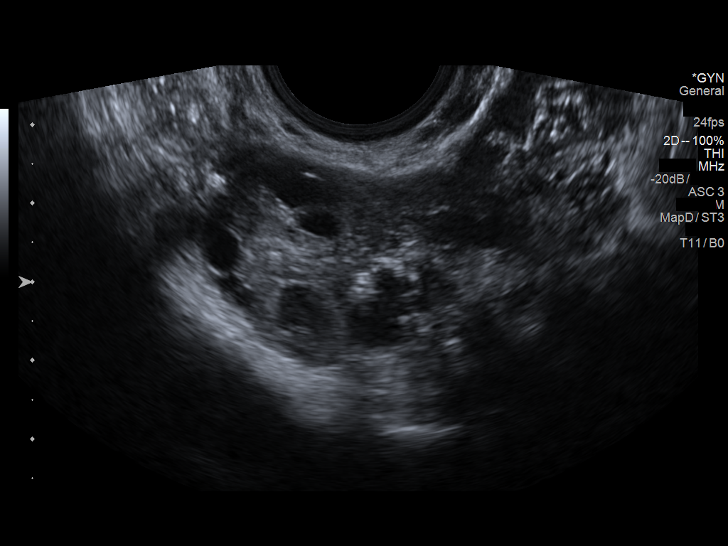
[im 72/87]
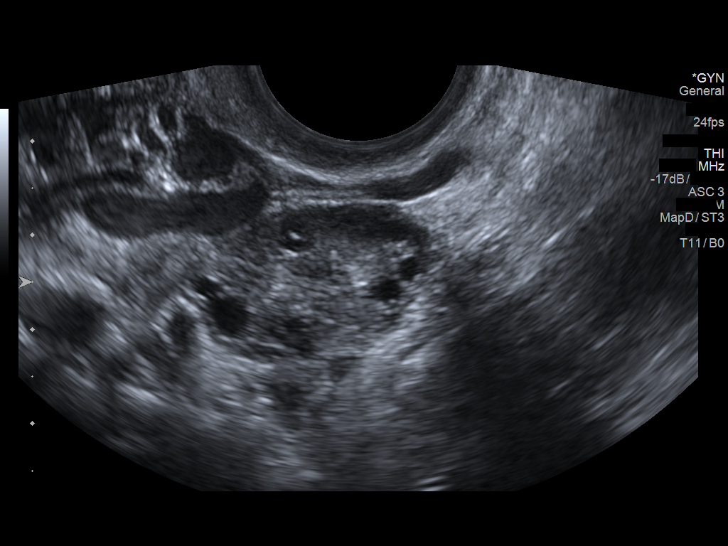
[im 79/87]
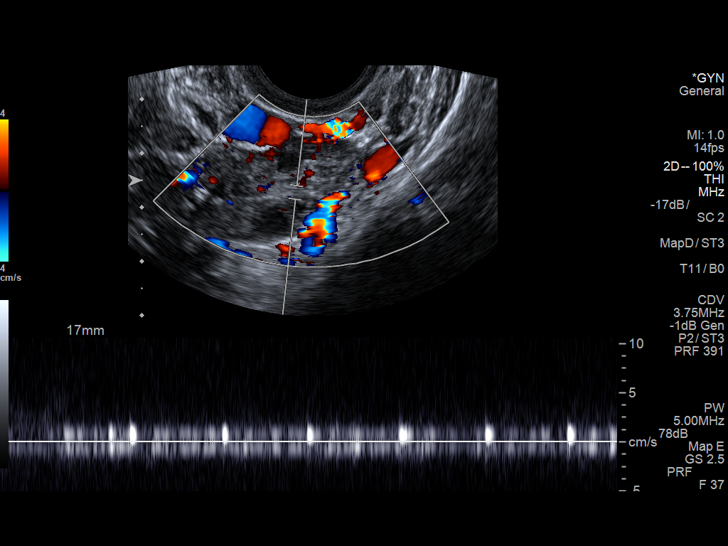
[im 87/87]
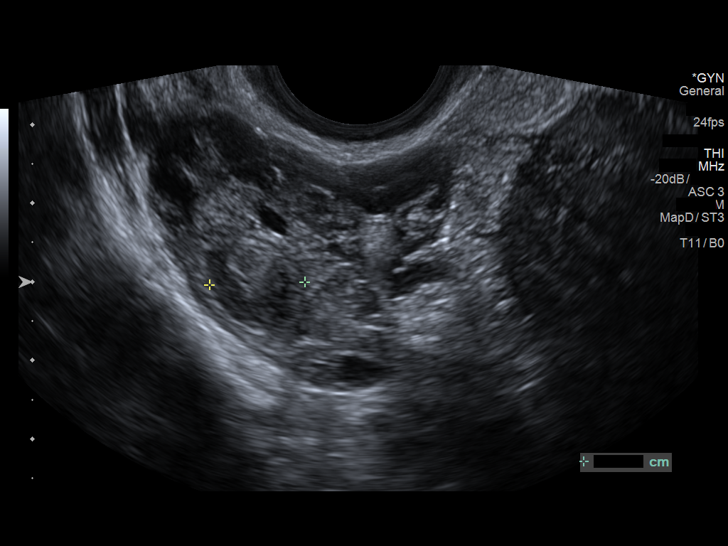

[13 of 25 positions shown; findings below may reference images not displayed]

FINDINGS: Uterus

Measurements: 9.2 x 4.4 x 5.9 cm. No fibroids or other mass
visualized.

Endometrium

Thickness: 8.4 mm.  No focal abnormality visualized.

Right ovary

Measurements: 3 x 1.7 x 1.9 cm. 1.1 x 0.9 x 1.2 cm hypoechoic right
ovarian mass without internal Doppler flow which may reflect a small
endometrioma or hemorrhagic cyst.

Left ovary

Measurements: 3.5 x 1.6 x 2.2 cm. Normal appearance/no adnexal mass.

Pulsed Doppler evaluation of both ovaries demonstrates normal
low-resistance arterial and venous waveforms.

Other findings

No abnormal free fluid.
IMPRESSION: 1. No active ovarian torsion.
2. 1.1 x 0.9 x 1.2 cm right ovarian avascular mass which may reflect
a small endometrioma or hemorrhagic cyst. Short-interval follow up
ultrasound in 6-12 weeks is recommended, preferably during the week
following the patient's normal menses.

## 2019-03-08 ENCOUNTER — Other Ambulatory Visit: Payer: Self-pay

## 2019-03-08 ENCOUNTER — Emergency Department (HOSPITAL_BASED_OUTPATIENT_CLINIC_OR_DEPARTMENT_OTHER)
Admission: EM | Admit: 2019-03-08 | Discharge: 2019-03-09 | Disposition: A | Discharge status: Home / Self Care | Payer: BC Managed Care – PPO | Attending: Emergency Medicine | Admitting: Emergency Medicine

## 2019-03-08 ENCOUNTER — Encounter (HOSPITAL_BASED_OUTPATIENT_CLINIC_OR_DEPARTMENT_OTHER): Payer: Self-pay | Admitting: *Deleted

## 2019-03-08 DIAGNOSIS — H81399 Other peripheral vertigo, unspecified ear: Secondary | ICD-10-CM

## 2019-03-08 DIAGNOSIS — R55 Syncope and collapse: Secondary | ICD-10-CM | POA: Insufficient documentation

## 2019-03-08 DIAGNOSIS — Z87891 Personal history of nicotine dependence: Secondary | ICD-10-CM | POA: Diagnosis not present

## 2019-03-08 DIAGNOSIS — R0789 Other chest pain: Secondary | ICD-10-CM | POA: Diagnosis not present

## 2019-03-08 DIAGNOSIS — Z79899 Other long term (current) drug therapy: Secondary | ICD-10-CM | POA: Diagnosis not present

## 2019-03-08 DIAGNOSIS — R112 Nausea with vomiting, unspecified: Secondary | ICD-10-CM | POA: Diagnosis not present

## 2019-03-08 DIAGNOSIS — D539 Nutritional anemia, unspecified: Secondary | ICD-10-CM

## 2019-03-08 DIAGNOSIS — R0602 Shortness of breath: Secondary | ICD-10-CM | POA: Diagnosis present

## 2019-03-08 LAB — CBC WITH DIFFERENTIAL/PLATELET
Abs Immature Granulocytes: 0.02 10*3/uL (ref 0.00–0.07)
Basophils Absolute: 0 10*3/uL (ref 0.0–0.1)
Basophils Relative: 0 %
Eosinophils Absolute: 0 10*3/uL (ref 0.0–0.5)
Eosinophils Relative: 0 %
HCT: 35.2 % — ABNORMAL LOW (ref 36.0–46.0)
Hemoglobin: 11.7 g/dL — ABNORMAL LOW (ref 12.0–15.0)
Immature Granulocytes: 0 %
Lymphocytes Relative: 22 %
Lymphs Abs: 2 10*3/uL (ref 0.7–4.0)
MCH: 33.3 pg (ref 26.0–34.0)
MCHC: 33.2 g/dL (ref 30.0–36.0)
MCV: 100.3 fL — ABNORMAL HIGH (ref 80.0–100.0)
Monocytes Absolute: 0.5 10*3/uL (ref 0.1–1.0)
Monocytes Relative: 5 %
Neutro Abs: 6.4 10*3/uL (ref 1.7–7.7)
Neutrophils Relative %: 73 %
Platelets: 164 10*3/uL (ref 150–400)
RBC: 3.51 MIL/uL — ABNORMAL LOW (ref 3.87–5.11)
RDW: 12.7 % (ref 11.5–15.5)
WBC: 8.9 10*3/uL (ref 4.0–10.5)
nRBC: 0 % (ref 0.0–0.2)

## 2019-03-08 MED ORDER — ONDANSETRON 8 MG PO TBDP
8.0000 mg | ORAL_TABLET | Freq: Once | ORAL | Status: AC
  Administered 2019-03-08: 8 mg via ORAL
  Filled 2019-03-08: qty 1

## 2019-03-08 MED ORDER — MECLIZINE HCL 25 MG PO TABS
25.0000 mg | ORAL_TABLET | Freq: Once | ORAL | Status: AC
  Administered 2019-03-08: 25 mg via ORAL
  Filled 2019-03-08: qty 1

## 2019-03-08 NOTE — ED Provider Notes (Signed)
[facility] EMERGENCY DEPARTMENT Provider Note   CSN: [id number] Arrival date & time: 03/08/19  2233    History   Chief Complaint Chief Complaint  Patient presents with  . Shortness of Breath  . Dizziness  . Near Syncope    HPI Shirley [name] is a 38 y.o. female.   The history is provided by the patient.  Shortness of Breath  Dizziness  Associated symptoms: shortness of breath   Near Syncope  Associated symptoms include shortness of breath.  She has history of migraines and GERD and has been evaluated for episodes of syncope and near syncope.  Tonight, she comes in because of an episode of dizziness which was different from what she had been having previously.  She was watching Netflix when the room started spinning and she developed nausea and vomited once.  Dizziness seems to be better she stays still, worse with movement.  She denies tinnitus or hearing loss.  She did have an episode of near syncope at about 2:30 PM where it felt like her heart stopped and her vision went black for a few seconds.  She did not have actual loss of consciousness.  She has been noticing some shortness of breath and some tightness in her chest with exertion.  Work-up thus far has included echocardiogram, transesophageal echocardiogram, stress echocardiogram, and ZIO Patch cardiac monitoring.  She is scheduled for a tilt table test next week.  She is a non-smoker without any history of diabetes or hypertension or hyperlipidemia and there is no family history of premature coronary atherosclerosis.  She denies any recent contact with anyone with coronavirus.  Past Medical History:  Diagnosis Date  . Double vision   . Medical history non-contributory   . Migraines   . Numbness   . Severe menstrual cramps   . Weakness of hand     Patient Active Problem List   Diagnosis Date Noted  . Slurred speech 09/28/2014  . Migraine 09/28/2014  . Weakness of hand   . Unspecified symptom associated  with female genital organs 03/07/2013  . Allergic rhinitis, seasonal 03/07/2013  . GERD (gastroesophageal reflux disease) 03/07/2013    Past Surgical History:  Procedure Laterality Date  . BREAST LUMPECTOMY    . DILATION AND CURETTAGE OF UTERUS    . LAPAROSCOPIC TUBAL LIGATION Bilateral 08/05/2013   Procedure: LAPAROSCOPIC TUBAL LIGATION;  Surgeon: Antionette CharLisa Jackson-Moore, MD;  Location: [facility];  Service: Gynecology;  Laterality: Bilateral;  . rods in feet    . ROTATOR CUFF REPAIR       OB History    Gravida  5   Para  3   Term  3   Preterm      AB  2   Living  3     SAB  1   TAB  0   Ectopic      Multiple      Live Births  3            Home Medications    Prior to Admission medications   Medication Sig Start Date End Date Taking? Authorizing Provider  amLODipine (NORVASC) 5 MG tablet Take 1 tablet (5 mg total) by mouth daily. 12/03/17  Yes Brock BadHarper, Charles A, MD  carvedilol (COREG) 12.5 MG tablet Take 1 tablet (12.5 mg total) by mouth 2 (two) times daily with a meal. 12/03/17  Yes Brock BadHarper, Charles A, MD  ibuprofen (ADVIL,MOTRIN) 800 MG tablet Take 1 tablet (800 mg total) by mouth every 8 (  eight) hours as needed. 12/03/17  Yes Brock Bad, MD  metroNIDAZOLE (FLAGYL) 500 MG tablet Take 1 tablet (500 mg total) by mouth 2 (two) times daily. One po bid x 7 days 04/03/18   Molpus, John, MD  norethindrone (MICRONOR,CAMILA,ERRIN) 0.35 MG tablet Take 1 tablet (0.35 mg total) by mouth daily. 12/03/17   Brock Bad, MD  triamterene-hydrochlorothiazide (DYAZIDE) 37.5-25 MG capsule Take 1 each (1 capsule total) by mouth daily. 12/03/17   Brock Bad, MD    Family History Family History  Problem Relation Age of Onset  . Stroke Mother   . Abdominal Wall Hernia Mother   . Hypertension Mother   . Hypertension Father   . Heart disease Father        pacemaker  . Diabetes Sister   . Multiple sclerosis Sister   . Liver cancer Maternal Grandmother   . Ovarian  cancer Maternal Grandmother   . Pancreatic cancer Paternal Grandmother     Social History Social History   Tobacco Use  . Smoking status: Former Smoker    Packs/day: 0.25    Types: Cigarettes    Last attempt to quit: 02/17/2017    Years since quitting: 2.0  . Smokeless tobacco: Never Used  Substance Use Topics  . Alcohol use: No    Alcohol/week: 0.0 standard drinks  . Drug use: No     Allergies   Patient has no known allergies.   Review of Systems Review of Systems  Respiratory: Positive for shortness of breath.   Cardiovascular: Positive for near-syncope.  Neurological: Positive for dizziness.  All other systems reviewed and are negative.    Physical Exam Updated Vital Signs BP 111/75   Pulse 78   Temp 98.6 F (37 C) (Oral)   Resp 16   Ht 5\' 10"  (1.778 m)   Wt 63.5 kg   LMP 02/15/2019   SpO2 100%   BMI 20.09 kg/m   Physical Exam Vitals signs and nursing note reviewed.    38 year old female, resting comfortably and in no acute distress. Vital signs are normal. Oxygen saturation is 100%, which is normal. Head is normocephalic and atraumatic. PERRLA, EOMI. Oropharynx is clear. Neck is nontender and supple without adenopathy or JVD. Back is nontender and there is no CVA tenderness. Lungs are clear without rales, wheezes, or rhonchi. Chest is nontender. Heart has regular rate and rhythm without murmur. Abdomen is soft, flat, nontender without masses or hepatosplenomegaly and peristalsis is normoactive. Extremities have no cyanosis or edema, full range of motion is present. Skin is warm and dry without rash. Neurologic: Mental status is normal, cranial nerves are intact, there are no motor or sensory deficits.  There is no nystagmus.  Dizziness is reproduced by passive head movement.  ED Treatments / Results  Labs (all labs ordered are listed, but only abnormal results are displayed) Labs Reviewed  CBC WITH DIFFERENTIAL/PLATELET - Abnormal; Notable for the  following components:      Result Value   RBC 3.51 (*)    Hemoglobin 11.7 (*)    HCT 35.2 (*)    MCV 100.3 (*)    All other components within normal limits  BASIC METABOLIC PANEL - Abnormal; Notable for the following components:   Glucose, Bld 106 (*)    Creatinine, Ser 1.11 (*)    All other components within normal limits  TROPONIN I    EKG EKG Interpretation  Date/Time:  Tuesday Mar 08 2019 22:46:42 EDT Ventricular Rate:  75 PR Interval:    QRS Duration: 81 QT Interval:  387 QTC Calculation: 433 R Axis:   34 Text Interpretation:  Sinus rhythm RSR' in V1 or V2, right VCD or RVH Otherwise within normal limits When compared with ECG of 09/22/2014. No significant change was found Confirmed by Dione Booze ([id number]) on 03/08/2019 10:53:10 PM   Procedures Procedures   Medications Ordered in ED Medications  ondansetron (ZOFRAN-ODT) disintegrating tablet 8 mg (has no administration in time range)  meclizine (ANTIVERT) tablet 25 mg (has no administration in time range)     Initial Impression / Assessment and Plan / ED Course  I have reviewed the triage vital signs and the nursing notes.  Pertinent lab results that were available during my care of the patient were reviewed by me and considered in my medical decision making (see chart for details).  Syncope and near syncope which is currently being evaluated by cardiology.  Tonight, she seemed to have an episode that was different and seems to be peripheral vertigo based on history and physical exam.  Old records are reviewed confirming work-up as noted in HPI.  Stress echocardiogram, echocardiogram, TEE were all normal.  ZIO Patch cardiac monitoring did show a 9 beat run of nonsustained ventricular tachycardia and several runs of atrial tachycardia, but it is not clear if those correlated with time she was symptomatic.  ECG today is unremarkable.  Will check screening labs.  Given episode of near syncope 9 hours ago, will check single  troponin.  We will check orthostatic vital signs.  She is given a dose of ondansetron and initial dose of meclizine.  Labs are significant for mild macrocytic anemia which is unchanged from baseline.  Orthostatic vital signs showed no significant change in pulse or blood pressure..  She had excellent relief of symptoms with meclizine and she is discharged with prescription for same.  She is to continue working with her cardiologist regarding evaluation of her near syncopal episodes.  Final Clinical Impressions(s) / ED Diagnoses   Final diagnoses:  Peripheral vertigo, unspecified laterality  Near syncope    ED Discharge Orders         Ordered    meclizine (ANTIVERT) 25 MG tablet  3 times daily PRN     03/09/19 0054           Dione Booze, MD 03/09/19 [phone]

## 2019-03-08 NOTE — ED Triage Notes (Signed)
States for a month she has had heart palpitations, sob, passing out, dizziness and vomiting. She is scheduled to see a cardiologist next week.

## 2019-03-09 LAB — BASIC METABOLIC PANEL
Anion gap: 6 (ref 5–15)
BUN: 18 mg/dL (ref 6–20)
CO2: 29 mmol/L (ref 22–32)
Calcium: 9.7 mg/dL (ref 8.9–10.3)
Chloride: 101 mmol/L (ref 98–111)
Creatinine, Ser: 1.11 mg/dL — ABNORMAL HIGH (ref 0.44–1.00)
GFR calc Af Amer: >60 mL/min (ref >60)
GFR calc non Af Amer: >60 mL/min (ref >60)
Glucose, Bld: 106 mg/dL — ABNORMAL HIGH (ref 70–99)
Potassium: 4.6 mmol/L (ref 3.5–5.1)
Sodium: 136 mmol/L (ref 135–145)

## 2019-03-09 LAB — TROPONIN I: Troponin I: <0.03 ng/mL (ref <0.03)

## 2019-03-09 MED ORDER — MECLIZINE HCL 25 MG PO TABS: 25.0000 mg | ORAL_TABLET | Freq: Three times a day (TID) | ORAL | 0 refills | Status: DC | PRN

## 2019-03-09 NOTE — ED Notes (Signed)
Pt's dizziness has resolved.

## 2019-03-09 NOTE — Discharge Instructions (Addendum)
Continue working with your cardiologist to find out why you are having the passing out spells.

## 2019-11-13 IMAGING — US US TRANSVAGINAL NON-OB
1 series · 14 of 25 positions shown · non-contrast
Comparison: 11/07/2016

CLINICAL DATA: Right lower quadrant pain



[Series 1: us transvaginal non-ob · 0.24mm/px · 14 of 53 slices shown]
[im 1/53]
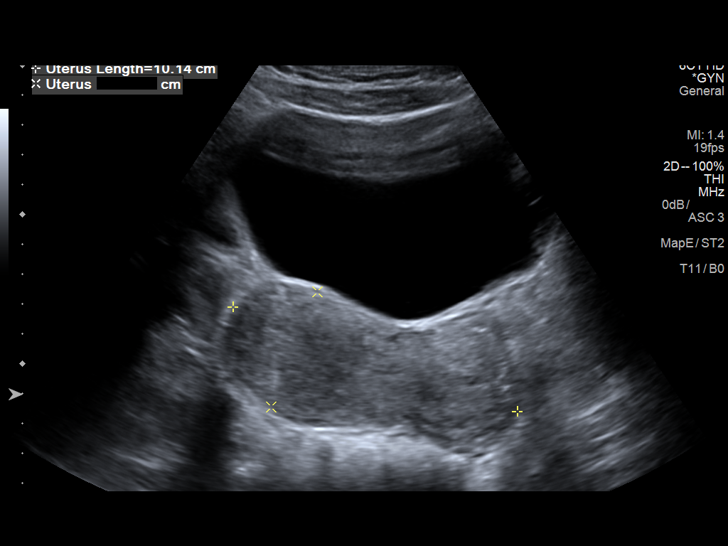
[im 5/53]
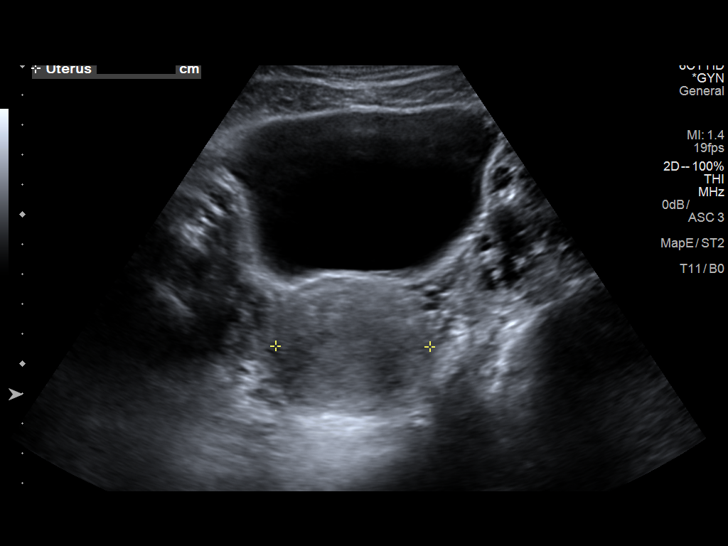
[im 9/53]
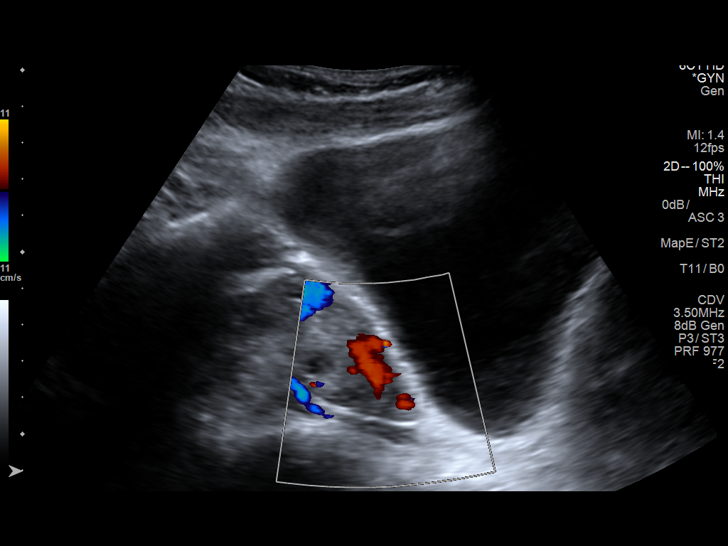
[im 14/53]
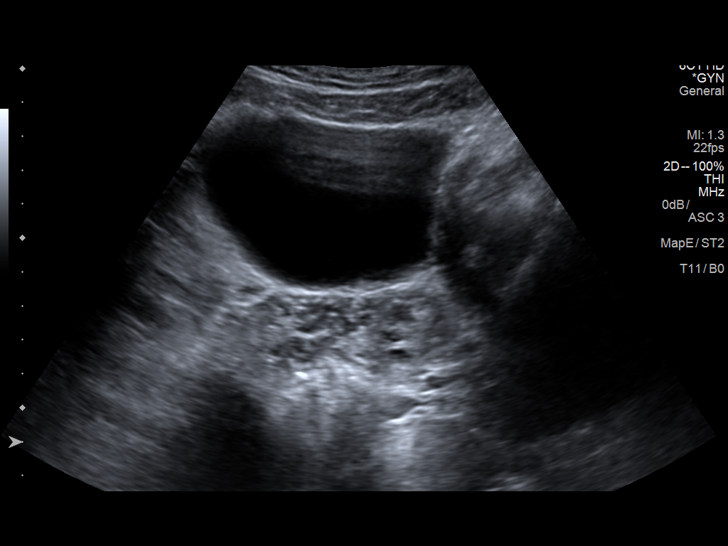
[im 18/53]
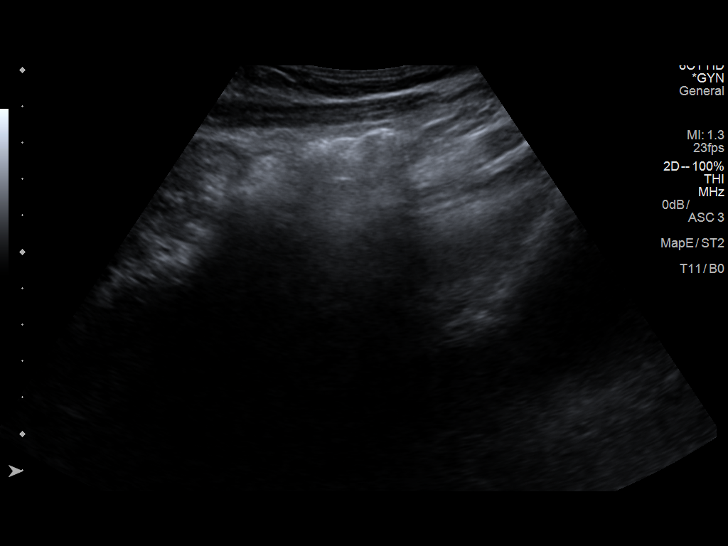
[im 20/53]
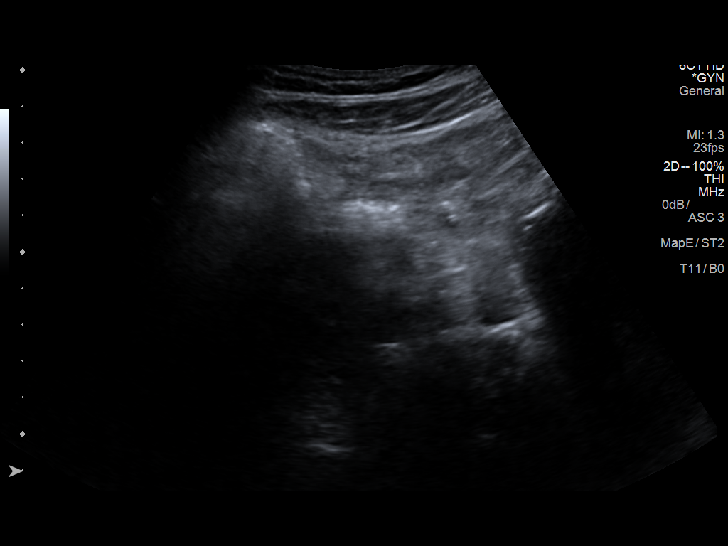
[im 24/53]
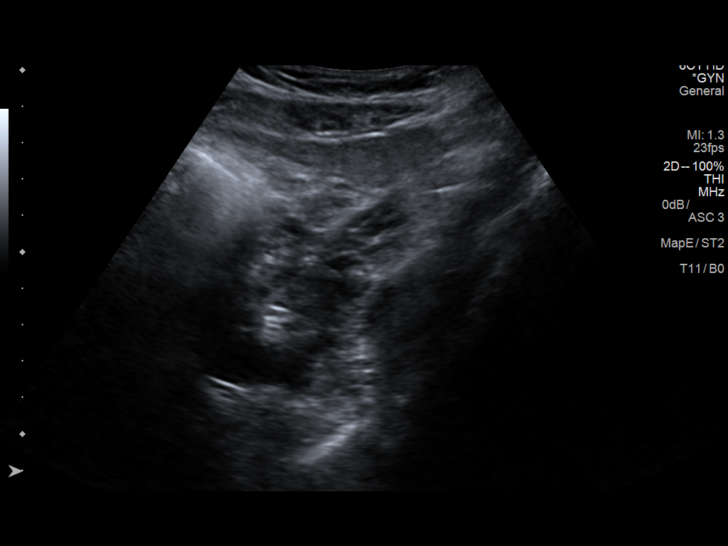
[im 29/53]
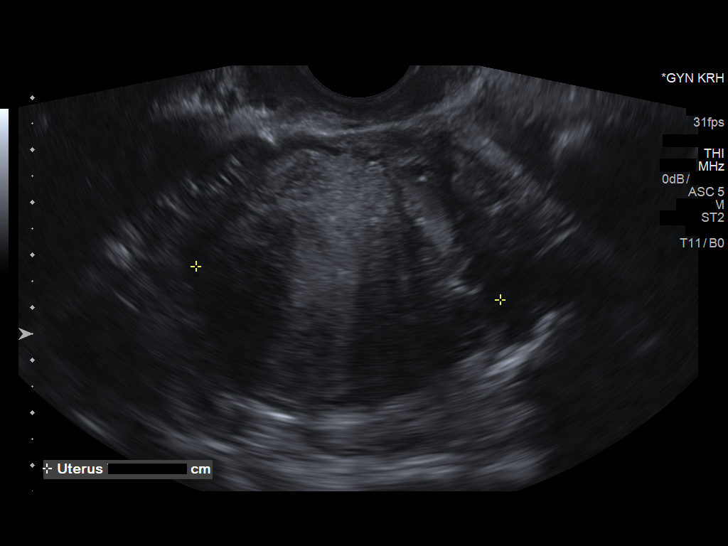
[im 33/53]
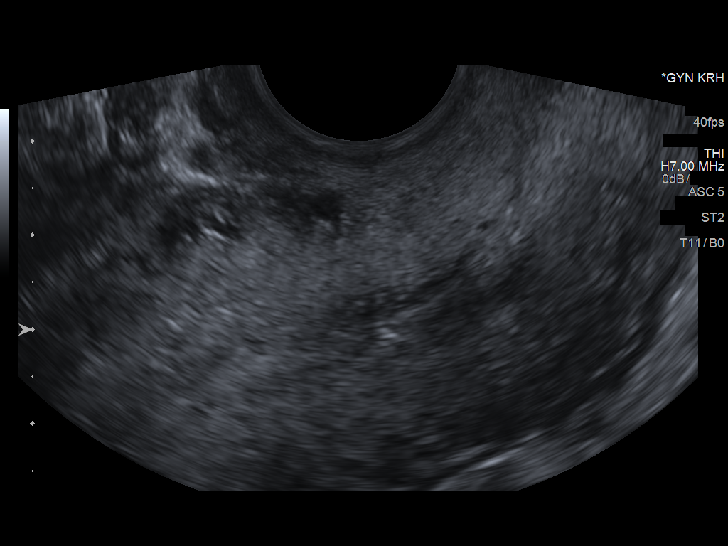
[im 35/53]
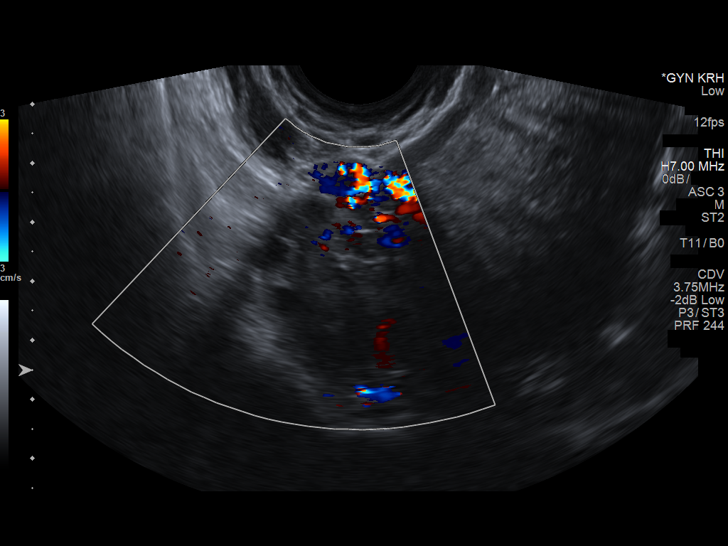
[im 40/53]
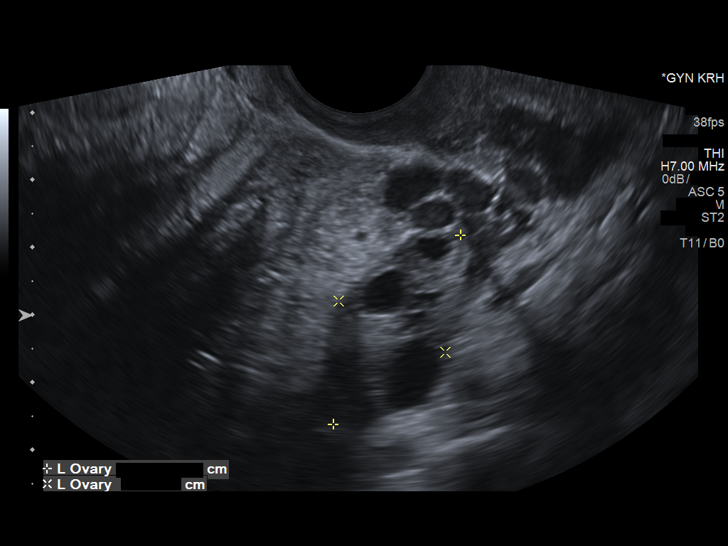
[im 44/53]
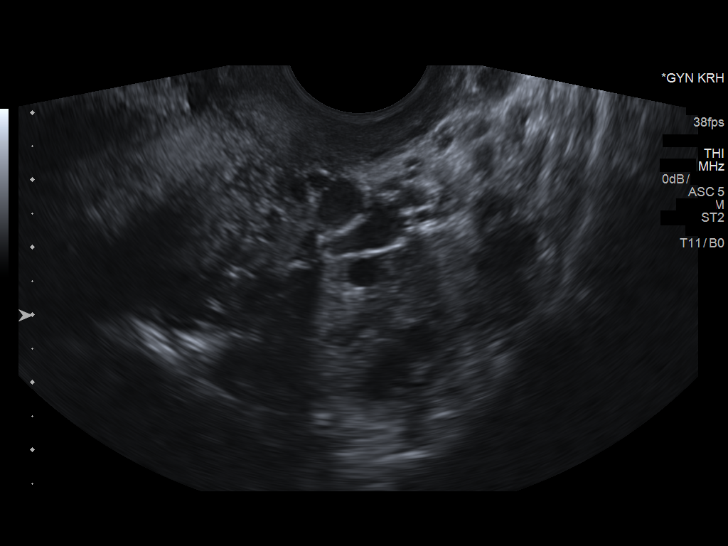
[im 48/53]
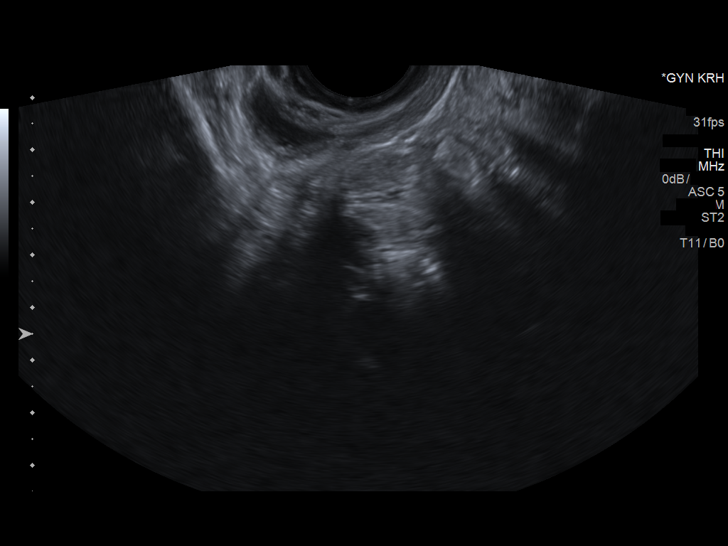
[im 53/53]
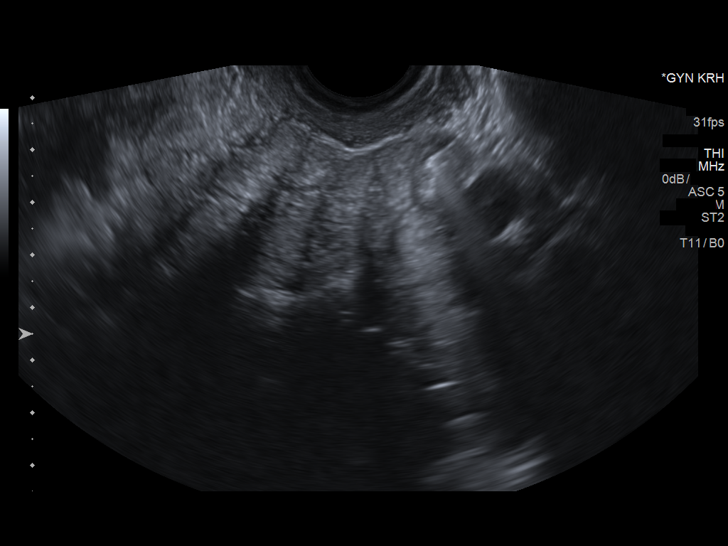

[14 of 25 positions shown; findings below may reference images not displayed]

FINDINGS: Uterus

Measurements: 9.2 x 4.6 x 5.8 cm. No fibroids or other mass
visualized.

Endometrium

Thickness: 7 mm in thickness.  No focal abnormality visualized.

Right ovary

Measurements: 3.5 x 2.2 x 2.2 cm. Normal appearance/no adnexal mass.

Left ovary

Measurements: 3.4 x 1.8 x 2.3 cm. Normal appearance/no adnexal mass.

Other findings

Trace physiologic free fluid
IMPRESSION: Unremarkable pelvic ultrasound.

## 2022-03-18 ENCOUNTER — Telehealth: Payer: Self-pay

## 2022-03-18 NOTE — Telephone Encounter (Signed)
NOTES SCANNED TO REFERRAL 

## 2022-06-24 ENCOUNTER — Ambulatory Visit: Payer: Medicaid Other | Attending: Internal Medicine | Admitting: Internal Medicine

## 2022-06-24 ENCOUNTER — Ambulatory Visit (INDEPENDENT_AMBULATORY_CARE_PROVIDER_SITE_OTHER): Payer: Medicaid Other

## 2022-06-24 ENCOUNTER — Encounter: Payer: Self-pay | Admitting: Internal Medicine

## 2022-06-24 DIAGNOSIS — R42 Dizziness and giddiness: Secondary | ICD-10-CM

## 2022-06-24 DIAGNOSIS — R002 Palpitations: Secondary | ICD-10-CM | POA: Diagnosis not present

## 2022-06-24 DIAGNOSIS — I471 Supraventricular tachycardia: Secondary | ICD-10-CM | POA: Diagnosis not present

## 2022-06-24 NOTE — Progress Notes (Signed)
ELECTROPHYSIOLOGY CONSULT NOTE  Patient ID: Shirley [name], MRN: [id number], DOB/AGE: 43-02-1981 43 y.o. Admit date: (Not on file) Date of Consult: 06/24/2022  Primary Physician: [facility] Primary Cardiologist:       Margeart Allender is a 41 y.o. female who is being seen today for the evaluation of POTS at the request of her PCP.    HPI Shirley [name] is a 41 y.o. female presents to establish care for "POTS  She carries a diagnosis of POTS noted in the [facility] EP records in the context of treated hypertension.  Tilt table test [facility] 6/20 "nondiagnostic tilt table test."  She  reports    Shower intolerance Heat intolerance Menses  Syncope/presyncope  Hypertension with a salt avid diet  It is not clear what her medications are  DATE TEST EF    2020   Apparently normal             Date Cr K Hgb  6/23 1.16 4.3 11.4 (Fe% sat 5%)            Past Medical History:  Diagnosis Date   Double vision    Medical history non-contributory    Migraines    Numbness    Severe menstrual cramps    Weakness of hand       Surgical History:  Past Surgical History:  Procedure Laterality Date   BREAST LUMPECTOMY     DILATION AND CURETTAGE OF UTERUS     LAPAROSCOPIC TUBAL LIGATION Bilateral 08/05/2013   Procedure: LAPAROSCOPIC TUBAL LIGATION;  Surgeon: Antionette Char, MD;  Location: [facility];  Service: Gynecology;  Laterality: Bilateral;   rods in feet     ROTATOR CUFF REPAIR       Home Meds: Current Meds  Medication Sig   ferrous sulfate 325 (65 FE) MG tablet Take by mouth.   lisinopril (ZESTRIL) 40 MG tablet Take 1 tablet by mouth daily.   propranolol (INDERAL) 10 MG tablet Take by mouth.   Vitamin D, Ergocalciferol, (DRISDOL) 1.25 MG (50000 UNIT) CAPS capsule Take 50,000 Units by mouth once a week.    Allergies: No Known Allergies  Social History   Socioeconomic History   Marital status: Married    Spouse name: Not on file    Number of children: 3   Years of education: college   Highest education level: Not on file  Occupational History   Occupation: Homemaker  Tobacco Use   Smoking status: Former    Packs/day: 0.25    Types: Cigarettes    Quit date: 02/17/2017    Years since quitting: 5.3   Smokeless tobacco: Never  Substance and Sexual Activity   Alcohol use: No    Alcohol/week: 0.0 standard drinks of alcohol   Drug use: No   Sexual activity: Yes    Partners: Male    Birth control/protection: Surgical  Other Topics Concern   Not on file  Social History Narrative   Patient lives at home with her three children.    Patient is a homemaker.   Education college education.   Right handed.    2 cups coffee, 1 bottle of [employer] daily.   Social Determinants of Health   Financial Resource Strain: Not on file  Food Insecurity: Not on file  Transportation Needs: Not on file  Physical Activity: Not on file  Stress: Not on file  Social Connections: Not on file  Intimate Partner Violence: Not on file  Family History  Problem Relation Age of Onset   Stroke Mother    Abdominal Wall Hernia Mother    Hypertension Mother    Hypertension Father    Heart disease Father        pacemaker   Diabetes Sister    Multiple sclerosis Sister    Liver cancer Maternal Grandmother    Ovarian cancer Maternal Grandmother    Pancreatic cancer Paternal Grandmother      ROS:  Please see the history of present illness.     All other systems reviewed and negative.    Physical Exam:  Blood pressure (!) 150/106, pulse 63, height 5\' 10"  (1.778 m), weight 159 lb (72.1 kg), SpO2 99 %. General: Well developed, well nourished female in no acute distress. Head: Normocephalic, atraumatic, sclera non-icteric, no xanthomas, nares are without discharge. EENT: normal  Lymph Nodes:  none Neck: Negative for carotid bruits. JVD not elevated. Back:without scoliosis kyphosis Lungs: Clear bilaterally to auscultation  without wheezes, rales, or rhonchi. Breathing is unlabored. Heart: RRR with S1 S2. No  murmur . No rubs, or gallops appreciated. Abdomen: Soft, non-tender, non-distended with normoactive bowel sounds. No hepatomegaly. No rebound/guarding. No obvious abdominal masses. Msk:  Strength and tone appear normal for age. Extremities: No clubbing or cyanosis. No  edema.  Distal pedal pulses are 2+ and equal bilaterally.  Arachnodactyly and joint hypermobility Skin: Warm and Dry Neuro: Alert and oriented X 3. CN III-XII intact Grossly normal sensory and motor function . Psych:  Responds to questions appropriately with a normal affect.        EKG: Sinus     Assessment and Plan:  Hypertension  Exertional tachypalpitations  Syncope and presyncope  Gait instability  Joint hypermobility syndrome  The patient has a 4-year history of recurrent episodes characterized by syncope presyncope exercise intolerance and tachypalpitations.  Her comments that her heart rates go to 200+ suggest either hyperbole or an arrhythmia.  We will utilize an event recorder to try to clarify.  Her event recorder from 2020 is available only as a summary with heart rates up to 159 with nocturnal bradycardia   Her heat intolerance shower intolerance and menses intolerance all suggest dysautonomia however, we have no objective measurements is supported this diagnosis.  Even today, upon standing, she was extremely unstable even though she was hypertensive with a modest l change in her heart rate--it may well be that she will need tilt table testing repeated with be to be blood pressure monitoring to try to understand if in fact she has orthostatic hypotension and or orthostatic tachycardia.  She carries a diagnosis of POTS based on the tilt table test from [facility]; as noted above, that report is described as "nondiagnostic "and does not describe the syncope that she reports occurred.  She has been instructed to increase her  salt intake, fludrocortisone has been mentioned in the past notes.  I have told her to stop with the salt.  She is significantly hypertensive.  It is not clear what her medications are, and this despite multiple efforts.  She will call [facility] when she gets home.  I think the idea of clonidine and a beta-blocker are a reasonable combination; we will does have to instruct her in the importance of not stopping them abruptly.  Her gait instability is strikingly.  I have suggested that she have her primary care doctor refer her to neurology for further evaluation.  We also discussed the mental health aspects of this for  years of debilitating illness and suggested [facility] is a possibility          Sherryl Manges

## 2022-06-24 NOTE — Progress Notes (Unsigned)
Enrolled for [employer] to mail a ZIO XT long term holter monitor to the patients address on file.  

## 2022-06-24 NOTE — Patient Instructions (Signed)
Medication Instructions:  Your physician recommends that you continue on your current medications as directed. Please refer to the Current Medication list given to you today.  *If you need a refill on your cardiac medications before your next appointment, please call your pharmacy*   Lab Work: None ordered.  If you have labs (blood work) drawn today and your tests are completely normal, you will receive your results only by: MyChart Message (if you have MyChart) OR A paper copy in the mail If you have any lab test that is abnormal or we need to change your treatment, we will call you to review the results.   Testing/Procedures: ZIO XT- Long Term Monitor Instructions  Your physician has requested you wear a ZIO patch monitor for 14 days.  This is a single patch monitor. [employer] supplies one patch monitor per enrollment. Additional stickers are not available. Please do not apply patch if you will be having a Nuclear Stress Test,  Echocardiogram, Cardiac CT, MRI, or Chest Xray during the period you would be wearing the  monitor. The patch cannot be worn during these tests. You cannot remove and re-apply the  ZIO XT patch monitor.  Your ZIO patch monitor will be mailed 3 day USPS to your address on file. It may take 3-5 days  to receive your monitor after you have been enrolled.  Once you have received your monitor, please review the enclosed instructions. Your monitor  has already been registered assigning a specific monitor serial # to you.  Billing and Patient Assistance Program Information  We have supplied [employer] with any of your insurance information on file for billing purposes. [employer] offers a sliding scale Patient Assistance Program for patients that do not have  insurance, or whose insurance does not completely cover the cost of the ZIO monitor.  You must apply for the Patient Assistance Program to qualify for this discounted rate.  To apply, please call [employer] at  [phone], select option 4, select option 2, ask to apply for  Patient Assistance Program. [employer] will ask your household income, and how many people  are in your household. They will quote your out-of-pocket cost based on that information.  [employer] will also be able to set up a 12-month, interest-free payment plan if needed.  Applying the monitor   Shave hair from upper left chest.  Hold abrader disc by orange tab. Rub abrader in 40 strokes over the upper left chest as  indicated in your monitor instructions.  Clean area with 4 enclosed alcohol pads. Let dry.  Apply patch as indicated in monitor instructions. Patch will be placed under collarbone on left  side of chest with arrow pointing upward.  Rub patch adhesive wings for 2 minutes. Remove white label marked "1". Remove the white  label marked "2". Rub patch adhesive wings for 2 additional minutes.  While looking in a mirror, press and release button in center of patch. A small green light will  flash 3-4 times. This will be your only indicator that the monitor has been turned on.  Do not shower for the first 24 hours. You may shower after the first 24 hours.  Press the button if you feel a symptom. You will hear a small click. Record Date, Time and  Symptom in the Patient Logbook.  When you are ready to remove the patch, follow instructions on the last 2 pages of Patient  Logbook. Stick patch monitor onto the last page of Patient Logbook.  Place Patient   Logbook in the blue and white box. Use locking tab on box and tape box closed  securely. The blue and white box has prepaid postage on it. Please place it in the mailbox as  soon as possible. Your physician should have your test results approximately 7 days after the  monitor has been mailed back to [facility].  Call [facility] Customer Care at [phone] if you have questions regarding  your ZIO XT patch monitor. Call them immediately if you see an orange light  blinking on your  monitor.  If your monitor falls off in less than 4 days, contact our Monitor department at [phone].  If your monitor becomes loose or falls off after 4 days call [employer] at [phone] for  suggestions on securing your monitor    Follow-Up: At [facility], you and your health needs are our priority.  As part of our continuing mission to provide you with exceptional heart care, we have created designated Provider Care Teams.  These Care Teams include your primary Cardiologist (physician) and Advanced Practice Providers (APPs -  Physician Assistants and Nurse Practitioners) who all work together to provide you with the care you need, when you need it.  We recommend signing up for the patient portal called "MyChart".  Sign up information is provided on this After Visit Summary.  MyChart is used to connect with patients for Virtual Visits (Telemedicine).  Patients are able to view lab/test results, encounter notes, upcoming appointments, etc.  Non-urgent messages can be sent to your provider as well.   To learn more about what you can do with MyChart, go to [email or web address].    Your next appointment:   4 week telephone visit with Dr Graciela Husbands - I will have his scheduler call you with this appointment.  Other Instructions  ** Stop Salt  ** Increase Fluids  ** [facility] - [phone]  ** Neurology Referral by PCP  Important Information About Sugar

## 2022-06-27 DIAGNOSIS — R002 Palpitations: Secondary | ICD-10-CM | POA: Diagnosis not present

## 2022-06-27 DIAGNOSIS — R42 Dizziness and giddiness: Secondary | ICD-10-CM | POA: Diagnosis not present

## 2022-07-24 ENCOUNTER — Ambulatory Visit: Payer: BC Managed Care – PPO | Attending: Internal Medicine | Admitting: Internal Medicine

## 2022-07-24 ENCOUNTER — Encounter: Payer: Self-pay | Admitting: Internal Medicine

## 2022-07-24 VITALS — BP 134/85 | Ht 70.0 in | Wt 162.0 lb

## 2022-07-24 DIAGNOSIS — R002 Palpitations: Secondary | ICD-10-CM | POA: Diagnosis not present

## 2022-07-24 DIAGNOSIS — I10 Essential (primary) hypertension: Secondary | ICD-10-CM | POA: Diagnosis not present

## 2022-07-24 DIAGNOSIS — R42 Dizziness and giddiness: Secondary | ICD-10-CM

## 2022-07-24 NOTE — Patient Instructions (Signed)
Medication Instructions:  Your physician has recommended you make the following change in your medication:   ** Hold Propranolol x 2 weeks   *If you need a refill on your cardiac medications before your next appointment, please call your pharmacy*   Lab Work: None ordered.  If you have labs (blood work) drawn today and your tests are completely normal, you will receive your results only by: [place] (if you have MyChart) OR A paper copy in the mail If you have any lab test that is abnormal or we need to change your treatment, we will call you to review the results.   Testing/Procedures: None ordered.    Follow-Up: At [facility], you and your health needs are our priority.  As part of our continuing mission to provide you with exceptional heart care, we have created designated Provider Care Teams.  These Care Teams include your primary Cardiologist (physician) and Advanced Practice Providers (APPs -  Physician Assistants and Nurse Practitioners) who all work together to provide you with the care you need, when you need it.  We recommend signing up for the patient portal called "MyChart".  Sign up information is provided on this After Visit Summary.  MyChart is used to connect with patients for Virtual Visits (Telemedicine).  Patients are able to view lab/test results, encounter notes, upcoming appointments, etc.  Non-urgent messages can be sent to your provider as well.   To learn more about what you can do with MyChart, go to [email or web address].    Your next appointment:   Wednesday,10/22/2021 at 345pm - MyChart Video Visit with Dr Caryl Comes.  Other Instructions Please send [place] a MyChart message in 3 weeks and let us know if you have improvement with fatigue.  Important Information About Sugar

## 2022-07-24 NOTE — Progress Notes (Signed)
Electrophysiology TeleHealth Note      Date:  07/24/2022   ID:  Shirley [name], DOB 1981-09-14, MRN [id number]  Location: patient's home  Provider location: [address], [place]  Evaluation Performed: Follow-up visit  PCP:  [place]  Cardiologist:     Electrophysiologist:  SK   Chief Complaint:  ?POTS  History of Present Illness:    Shirley [name] is a 42 y.o. female who presents via audio/video conferencing for a telehealth visit today.  Since last being seen in our clinic for purpose of establishing care for a diagnosis of POTS  which was not tenable from the data available including the presumptive tool of TILT TABLE at [facility], significant hypertension and descriptions of tachycardia to >200 and has been seen intercurrently by her PCP "doing well"  the patient reports having had typical symptoms while she was wearing the below described event recorder that she had syncope/presyncope with her triggered events  Interval ZIO monitor  :Duration: 14d  Findings--HR  avg 75  Min 45-Max 153  PVCs Rare, less than 1%   PACs Rare, less than 1%  SVT Nonsustained  fastest 121 bpm for 7 beats   Symptoms:none Triggered: event x 5  all with PVCs     The patient denies symptoms of fevers, chills, cough, or new SOB worrisome for COVID 19.    Past Medical History:  Diagnosis Date   Double vision    Medical history non-contributory    Migraines    Numbness    Severe menstrual cramps    Weakness of hand     Past Surgical History:  Procedure Laterality Date   BREAST LUMPECTOMY     DILATION AND CURETTAGE OF UTERUS     LAPAROSCOPIC TUBAL LIGATION Bilateral 08/05/2013   Procedure: LAPAROSCOPIC TUBAL LIGATION;  Surgeon: Lahoma Crocker, MD;  Location: [facility];  Service: Gynecology;  Laterality: Bilateral;   rods in feet     ROTATOR CUFF REPAIR      Current Outpatient Medications  Medication Sig Dispense Refill   ALPRAZolam (XANAX) 0.5 MG tablet  Take 0.5 mg by mouth as needed for sleep.     cloNIDine (CATAPRES) 0.1 MG tablet Take 0.1 mg by mouth 2 (two) times daily.     ferrous sulfate 325 (65 FE) MG tablet Take by mouth.     propranolol (INDERAL) 10 MG tablet Take by mouth.     Vitamin D, Ergocalciferol, (DRISDOL) 1.25 MG (50000 UNIT) CAPS capsule Take 50,000 Units by mouth once a week.     No current facility-administered medications for this visit.    Allergies:   Patient has no known allergies.      Exam:    Vital Signs:  BP 134/85   Ht 5\' 10"  (1.778 m)   Wt 162 lb (73.5 kg)   BMI 23.24 kg/m        Labs/Other Tests and Data Reviewed:    Recent Labs: No results found for requested labs within last 365 days.   Wt Readings from Last 3 Encounters:  07/24/22 162 lb (73.5 kg)  06/24/22 159 lb (72.1 kg)  03/08/19 140 lb (63.5 kg)     Other studies personally reviewed: Additional studies/ records that were reviewed today include: event recorder  Review of the above records today demonstrates: Prior radiographs:     ASSESSMENT & PLAN:    Hypertension   Exertional tachypalpitations   Syncope and presyncope   Gait instability  Fatigue  Joint hypermobility syndrome   The patient's loop recorder demonstrates no tachycardia to speak of, a few extra beats at about 120 bpm.  Interestingly, she describes having had syncope with her triggered events each of which was associated with PVCs.  It is I think physiologically unlikely that an isolated PVC can have enough impact on venous return and cardiac filling to trigger his Bezold-Jarisch reflex and neurocardiogenic syncope; however, physiologically her response to her PVC may be sufficient to do so.  We have reviewed this extensively today.  In this regard, I suggested that biofeedback as opposed to drug therapy would be preferred.  It is also not clear to me at this juncture yet, whether her symptoms were better on the clonidine and the propranolol which she  resumed after she had stopped them abruptly to wear the monitor.  She is agreeable to biofeedback.  We will also undertake an exclusion trial of the propranolol to see whether it is responsible for the fatigue that abated when she held it in the clonidine.  Her blood pressure is better on this combination.  She is being treated for's anemia     Follow-up: 3 weeks message on MyChart as to fatigue, 12 weeks telehealth follow-up    Current medicines are reviewed at length with the patient today.   The patient  concerns regarding her medicines.  The following changes were made today: Hold the propranolol for 2 weeks  Labs/ tests ordered today include:  No orders of the defined types were placed in this encounter.     Today, I have spent 31 minutes with the patient with telehealth technology discussing the above.  Signed, Sherryl Manges, MD  07/24/2022 8:22 AM     [facility] [address] [place] [address] [phone] (office) [phone] (fax)

## 2022-08-15 NOTE — Telephone Encounter (Signed)
Called to talk with patient; HR faster with overdoing, less fatigue.so will continue off propranolol-- but continue on clonidine Left leg swelling>> "really just feels weird"

## 2022-09-03 NOTE — Telephone Encounter (Signed)
We can try low dose of other betablockers Can you get a list of any that she has previously taken  Thanks SK

## 2022-10-22 ENCOUNTER — Encounter: Payer: Self-pay | Admitting: Internal Medicine

## 2022-10-22 ENCOUNTER — Ambulatory Visit: Payer: Medicaid Other | Attending: Internal Medicine | Admitting: Internal Medicine

## 2022-10-22 VITALS — BP 152/112 | HR 82 | Ht 70.0 in

## 2022-10-22 DIAGNOSIS — R0683 Snoring: Secondary | ICD-10-CM | POA: Diagnosis not present

## 2022-10-22 DIAGNOSIS — G4730 Sleep apnea, unspecified: Secondary | ICD-10-CM

## 2022-10-22 MED ORDER — CLONIDINE 0.2 MG/24HR TD PTWK: 0.2000 mg | MEDICATED_PATCH | TRANSDERMAL | 12 refills | Status: DC

## 2022-10-22 NOTE — Patient Instructions (Signed)
Medication Instructions:  Your physician has recommended you make the following change in your medication:   ** Increase your oral Clonidine 0.1mg  to 2 tablets by mouth twice daily.  ** I have sent in the Clonidine patches to your pharmacy.  If these are affordable for you please discontinue to oral Clonidine.  *If you need a refill on your cardiac medications before your next appointment, please call your pharmacy*   Lab Work: None ordered.  If you have labs (blood work) drawn today and your tests are completely normal, you will receive your results only by: [place] (if you have MyChart) OR A paper copy in the mail If you have any lab test that is abnormal or we need to change your treatment, we will call you to review the results.   Testing/Procedures: Your physician has recommended that you have a sleep study. This test records several body functions during sleep, including: brain activity, eye movement, oxygen and carbon dioxide blood levels, heart rate and rhythm, breathing rate and rhythm, the flow of air through your mouth and nose, snoring, body muscle movements, and chest and belly movement.    Follow-Up: At [facility], you and your health needs are our priority.  As part of our continuing mission to provide you with exceptional heart care, we have created designated Provider Care Teams.  These Care Teams include your primary Cardiologist (physician) and Advanced Practice Providers (APPs -  Physician Assistants and Nurse Practitioners) who all work together to provide you with the care you need, when you need it.  We recommend signing up for the patient portal called "MyChart".  Sign up information is provided on this After Visit Summary.  MyChart is used to connect with patients for Virtual Visits (Telemedicine).  Patients are able to view lab/test results, encounter notes, upcoming appointments, etc.  Non-urgent messages can be sent to your provider as well.    To learn more about what you can do with MyChart, go to [email or web address].    Your next appointment:   4 months with Dr Caryl Comes - Appointment has been scheduled for 03/10/2023 at 3:45pm.  Other Instructions If you have any questions please let us know.  Important Information About [place]

## 2022-10-22 NOTE — Progress Notes (Signed)
Electrophysiology TeleHealth Note      Date:  10/22/2022   ID:  Shirley [name], DOB Aug 09, 1981, MRN [id number]  Location: patient's home  Provider location: [address], [place]  Evaluation Performed: Follow-up visit  PCP:  [place]  Cardiologist:     Electrophysiologist:  SK   Chief Complaint:  ?POTS  History of Present Illness:    Shirley [name] is a 42 y.o. female who presents via audio/video conferencing for a telehealth visit today.  Since last being seen in our clinic for purpose of establishing care for a diagnosis of POTS  which was not tenable from the data available including the presumptive tool of TILT TABLE at [facility], significant hypertension and descriptions of tachycardia to >200 and has been seen intercurrently by her PCP "doing well"  the patient reports having had typical symptoms while she was wearing the below described event recorder that she had syncope/presyncope with her triggered events  Interval ZIO monitor  :Duration: 14d  Findings--HR  avg 75  Min 45-Max 153  PVCs Rare, less than 1%   PACs Rare, less than 1%  SVT Nonsustained  fastest 121 bpm for 7 beats   Symptoms:none Triggered: event x 5  all with PVCs   Propranolol discontinuation did not help the fatigue although taking it made the fatigue worse.  Clonidine has helped with the palpitations.  Blood pressure remains elevated.  She did see a new PCP  Sleep disordered breathing   The patient denies symptoms of fevers, chills, cough, or new SOB worrisome for COVID 19.    Past Medical History:  Diagnosis Date   Double vision    Medical history non-contributory    Migraines    Numbness    Severe menstrual cramps    Weakness of hand     Past Surgical History:  Procedure Laterality Date   BREAST LUMPECTOMY     DILATION AND CURETTAGE OF UTERUS     LAPAROSCOPIC TUBAL LIGATION Bilateral 08/05/2013   Procedure: LAPAROSCOPIC TUBAL LIGATION;  Surgeon: Lahoma Crocker, MD;  Location: [facility];  Service: Gynecology;  Laterality: Bilateral;   rods in feet     ROTATOR CUFF REPAIR      Current Outpatient Medications  Medication Sig Dispense Refill   ALPRAZolam (XANAX) 0.5 MG tablet Take 0.5 mg by mouth as needed for sleep.     cloNIDine (CATAPRES) 0.1 MG tablet Take 0.1 mg by mouth 2 (two) times daily.     ferrous sulfate 325 (65 FE) MG tablet Take by mouth.     propranolol (INDERAL) 10 MG tablet Take by mouth. 2 tabs by mouth 3 times per day     Vitamin D, Ergocalciferol, (DRISDOL) 1.25 MG (50000 UNIT) CAPS capsule Take 50,000 Units by mouth once a week.     No current facility-administered medications for this visit.    Allergies:   Patient has no known allergies.      Exam:    Vital Signs:  BP (!) 152/112 Comment: patient provided  Pulse 82   Ht 5\' 10"  (1.778 m)   LMP 10/21/2022   BMI 23.24 kg/m        Labs/Other Tests and Data Reviewed:    Recent Labs: No results found for requested labs within last 365 days.   Wt Readings from Last 3 Encounters:  07/24/22 162 lb (73.5 kg)  06/24/22 159 lb (72.1 kg)  03/08/19 140 lb (63.5 kg)     Other  studies personally reviewed: Additional studies/ records that were reviewed today include: event recorder  Review of the above records today demonstrates: Prior radiographs:     ASSESSMENT & PLAN:    Hypertension   Exertional tachypalpitations   Syncope and presyncope   Gait instability   Fatigue  Joint hypermobility syndrome  With increasing fatigue and sleep disordered breathing, she is agreeable to proceed with a sleep study.  Propranolol was poorly tolerated from a fatigue point of view, she has tolerated the clonidine better; we will increase it as it was effective for the palpitations from 0.1--0.2 twice daily and we will see whether the TTS-2 is cost available.  This may also help control her blood pressure, treating sleep apnea may also be helpful in this regard     Given the infrequency but the discombobulation of her ectopy have suggested she pursue biofeedback.  There may be a role for something like amitriptyline based on the long tropical work of [employer]   COVID 19 screen The patient denies symptoms of COVID 19 at this time.  The importance of social distancing was discussed today.  Follow-up:  30m    Current medicines are reviewed at length with the patient today.   The patient  concerns regarding her medicines.  The following changes were made today:  increase clonidine to 0.2 bid but will Rx clonidine TTS-2   Labs/ tests ordered today include:  sleep study  Biofeedback--integrative therapies   No orders of the defined types were placed in this encounter.   Future tests ( post COVID )   in   months  Patient Risk:  after full review of this patients clinical status, I feel that they are at [place] risk at this time.  Today, I have spent 21 minutes with the patient with telehealth technology discussing the above.         Signed, Virl Axe, MD  10/22/2022 4:26 PM     [place] [address] [phone] (office) [phone] (fax)

## 2022-10-28 ENCOUNTER — Telehealth: Payer: Self-pay | Admitting: *Deleted

## 2022-10-28 NOTE — Telephone Encounter (Signed)
Prior Authorization for HST sent to [facility] via web portal. Tracking Number .  Prior Authorization is not required for the requested services

## 2022-11-04 ENCOUNTER — Ambulatory Visit (HOSPITAL_BASED_OUTPATIENT_CLINIC_OR_DEPARTMENT_OTHER): Payer: BLUE CROSS/BLUE SHIELD | Attending: Internal Medicine | Admitting: Cardiology

## 2022-11-04 DIAGNOSIS — G4730 Sleep apnea, unspecified: Secondary | ICD-10-CM | POA: Insufficient documentation

## 2022-11-04 DIAGNOSIS — R0683 Snoring: Secondary | ICD-10-CM | POA: Insufficient documentation

## 2022-11-09 NOTE — Procedures (Signed)
   Patient Name: Shirley [name], Shirley [name] Date: 11/04/2022 Gender: Female D.O.B: Jan 22, 1981 Age (years): 42 Referring Provider: Virl Axe Height (inches): 86 Interpreting Physician: Fransico Him MD, ABSM Weight (lbs): 154 RPSGT: Jacolyn Reedy BMI: 22 MRN: [id number] Neck Size: 13.50  CLINICAL INFORMATION Sleep Study Type: HST  Indication for sleep study: N/A  Epworth Sleepiness Score: 2  SLEEP STUDY TECHNIQUE A multi-channel overnight portable sleep study was performed. The channels recorded were: nasal airflow, thoracic respiratory movement, and oxygen saturation with a pulse oximetry. Snoring was also monitored.  MEDICATIONS Patient self administered medications include: N/A.  SLEEP ARCHITECTURE Patient was studied for 354 minutes. The sleep efficiency was 99.9 % and the patient was supine for 0%. The arousal index was 0.0 per hour.  RESPIRATORY PARAMETERS The overall AHI was 1.4 per hour, with a central apnea index of 0 per hour.  The oxygen nadir was 92% during sleep.  CARDIAC DATA Mean heart rate during sleep was 81.8 bpm.  IMPRESSIONS - No significant obstructive sleep apnea occurred during this study (AHI = 1.4/h). - The patient had minimal or no oxygen desaturation during the study (Min O2 = 92%) - No snoring was audible during this study.  DIAGNOSIS - Normal study  RECOMMENDATIONS - Avoid alcohol, sedatives and other CNS depressants that may worsen sleep apnea and disrupt normal sleep architecture. - Sleep hygiene should be reviewed to assess factors that may improve sleep quality. - Weight management and regular exercise should be initiated or continued. - Return to Sleep Center to discuss the results of this study - Patient may benefit from in-lab study  [Electronically signed] 11/09/2022 04:58 PM  Fransico Him MD, ABSM Diplomate, American Board of Sleep Medicine

## 2022-11-20 ENCOUNTER — Emergency Department (HOSPITAL_BASED_OUTPATIENT_CLINIC_OR_DEPARTMENT_OTHER): Payer: BLUE CROSS/BLUE SHIELD

## 2022-11-20 ENCOUNTER — Encounter (HOSPITAL_BASED_OUTPATIENT_CLINIC_OR_DEPARTMENT_OTHER): Payer: Self-pay | Admitting: Emergency Medicine

## 2022-11-20 ENCOUNTER — Emergency Department (HOSPITAL_COMMUNITY): Payer: BLUE CROSS/BLUE SHIELD

## 2022-11-20 ENCOUNTER — Other Ambulatory Visit: Payer: Self-pay

## 2022-11-20 ENCOUNTER — Emergency Department (HOSPITAL_BASED_OUTPATIENT_CLINIC_OR_DEPARTMENT_OTHER)
Admission: EM | Admit: 2022-11-20 | Discharge: 2022-11-20 | Disposition: A | Discharge status: Home / Self Care | Payer: BLUE CROSS/BLUE SHIELD | Attending: Emergency Medicine | Admitting: Emergency Medicine

## 2022-11-20 DIAGNOSIS — I10 Essential (primary) hypertension: Secondary | ICD-10-CM | POA: Diagnosis not present

## 2022-11-20 DIAGNOSIS — R519 Headache, unspecified: Secondary | ICD-10-CM

## 2022-11-20 DIAGNOSIS — Z79899 Other long term (current) drug therapy: Secondary | ICD-10-CM | POA: Insufficient documentation

## 2022-11-20 DIAGNOSIS — R202 Paresthesia of skin: Secondary | ICD-10-CM | POA: Insufficient documentation

## 2022-11-20 LAB — CBC WITH DIFFERENTIAL/PLATELET
Abs Immature Granulocytes: 0.01 10*3/uL (ref 0.00–0.07)
Basophils Absolute: 0.1 10*3/uL (ref 0.0–0.1)
Basophils Relative: 1 %
Eosinophils Absolute: 0.1 10*3/uL (ref 0.0–0.5)
Eosinophils Relative: 1 %
HCT: 32.4 % — ABNORMAL LOW (ref 36.0–46.0)
Hemoglobin: 11.2 g/dL — ABNORMAL LOW (ref 12.0–15.0)
Immature Granulocytes: 0 %
Lymphocytes Relative: 32 %
Lymphs Abs: 2.9 10*3/uL (ref 0.7–4.0)
MCH: 33.2 pg (ref 26.0–34.0)
MCHC: 34.6 g/dL (ref 30.0–36.0)
MCV: 96.1 fL (ref 80.0–100.0)
Monocytes Absolute: 0.5 10*3/uL (ref 0.1–1.0)
Monocytes Relative: 5 %
Neutro Abs: 5.6 10*3/uL (ref 1.7–7.7)
Neutrophils Relative %: 61 %
Platelets: 207 10*3/uL (ref 150–400)
RBC: 3.37 MIL/uL — ABNORMAL LOW (ref 3.87–5.11)
RDW: 13.3 % (ref 11.5–15.5)
WBC: 9.1 10*3/uL (ref 4.0–10.5)
nRBC: 0 % (ref 0.0–0.2)

## 2022-11-20 LAB — BASIC METABOLIC PANEL
Anion gap: 7 (ref 5–15)
BUN: 18 mg/dL (ref 6–20)
CO2: 26 mmol/L (ref 22–32)
Calcium: 9.1 mg/dL (ref 8.9–10.3)
Chloride: 102 mmol/L (ref 98–111)
Creatinine, Ser: 1.09 mg/dL — ABNORMAL HIGH (ref 0.44–1.00)
GFR, Estimated: >60 mL/min (ref >60)
Glucose, Bld: 97 mg/dL (ref 70–99)
Potassium: 3.5 mmol/L (ref 3.5–5.1)
Sodium: 135 mmol/L (ref 135–145)

## 2022-11-20 MED ORDER — MAGNESIUM SULFATE 2 GM/50ML IV SOLN
2.0000 g | Freq: Once | INTRAVENOUS | Status: AC
  Administered 2022-11-20: 2 g via INTRAVENOUS
  Filled 2022-11-20: qty 50

## 2022-11-20 MED ORDER — DEXAMETHASONE SODIUM PHOSPHATE 10 MG/ML IJ SOLN
10.0000 mg | Freq: Once | INTRAMUSCULAR | Status: AC
  Administered 2022-11-20: 10 mg via INTRAVENOUS
  Filled 2022-11-20: qty 1

## 2022-11-20 MED ORDER — DIPHENHYDRAMINE HCL 50 MG/ML IJ SOLN
25.0000 mg | Freq: Once | INTRAMUSCULAR | Status: AC
  Administered 2022-11-20: 25 mg via INTRAVENOUS
  Filled 2022-11-20: qty 1

## 2022-11-20 MED ORDER — METOCLOPRAMIDE HCL 5 MG/ML IJ SOLN
10.0000 mg | Freq: Once | INTRAMUSCULAR | Status: AC
  Administered 2022-11-20: 10 mg via INTRAVENOUS
  Filled 2022-11-20: qty 2

## 2022-11-20 MED ORDER — KETOROLAC TROMETHAMINE 30 MG/ML IJ SOLN
30.0000 mg | Freq: Once | INTRAMUSCULAR | Status: AC
  Administered 2022-11-20: 30 mg via INTRAVENOUS
  Filled 2022-11-20: qty 1

## 2022-11-20 NOTE — ED Provider Notes (Signed)
[facility]  Provider Note  CSN: [id number] Arrival date & time: 11/20/22 0250  History Chief Complaint  Patient presents with   Migraine    Shirley [name] is a 42 y.o. female with history of HTN (only on clonidine patch) and migraines as well as POTS and EDS reports onset of left sided headache earlier in the day on 1/31, had a brief episode of R arm weakness and slurred speech around the middle of the day but both headache and her other symptoms improved after she got home and took a nap. She reports tonight she has been having a heavy feeling in her Left arm. No loss of sensation or weakness, just feels like it is falling asleep. She has a history of insomnia, has been on Xanax at night for the last year or so, but ran out three days ago and is awaiting a refill. She has had similar symptoms when she's run out before.    Home Medications Prior to Admission medications   Medication Sig Start Date End Date Taking? Authorizing Provider  ALPRAZolam Duanne Moron) 0.5 MG tablet Take 0.5 mg by mouth as needed for sleep. 07/17/22   [provider]  cloNIDine (CATAPRES - DOSED IN MG/24 HR) 0.2 mg/24hr patch Place 1 patch (0.2 mg total) onto the skin once a week. 10/22/22   Deboraha Sprang, MD  cloNIDine (CATAPRES) 0.1 MG tablet Take 0.2 mg by mouth 2 (two) times daily. 07/11/22   [provider]  ferrous sulfate 325 (65 FE) MG tablet Take by mouth. 05/05/22   [provider]  propranolol (INDERAL) 10 MG tablet Take by mouth. 2 tabs by mouth 3 times per day 03/17/22   [provider]  Vitamin D, Ergocalciferol, (DRISDOL) 1.25 MG (50000 UNIT) CAPS capsule Take 50,000 Units by mouth once a week. 05/05/22   [provider]     Allergies    Patient has no known allergies.   Review of Systems   Review of Systems Please see HPI for pertinent positives and negatives  Physical Exam BP (!) 170/120 (BP Location: Right  Arm)   Pulse 67   Temp 98 F (36.7 C)   Resp 18   Ht 5\' 10"  (1.778 m)   Wt 69.9 kg   LMP 10/21/2022   SpO2 99%   BMI 22.10 kg/m   Physical Exam Vitals and nursing note reviewed.  Constitutional:      Appearance: Normal appearance.  HENT:     Head: Normocephalic and atraumatic.     Nose: Nose normal.     Mouth/Throat:     Mouth: Mucous membranes are moist.  Eyes:     Extraocular Movements: Extraocular movements intact.     Conjunctiva/sclera: Conjunctivae normal.  Cardiovascular:     Rate and Rhythm: Normal rate.  Pulmonary:     Effort: Pulmonary effort is normal.     Breath sounds: Normal breath sounds.  Abdominal:     General: Abdomen is flat.     Palpations: Abdomen is soft.     Tenderness: There is no abdominal tenderness.  Musculoskeletal:        General: No swelling. Normal range of motion.     Cervical back: Neck supple.  Skin:    General: Skin is warm and dry.  Neurological:     General: No focal deficit present.     Mental Status: She is alert and oriented to person, place, and time.  Cranial Nerves: No cranial nerve deficit.     Sensory: No sensory deficit.     Motor: No weakness.  Psychiatric:        Mood and Affect: Mood normal.     ED Results / Procedures / Treatments   EKG EKG Interpretation  Date/Time:  Thursday November 20 2022 02:58:52 EST Ventricular Rate:  74 PR Interval:  160 QRS Duration: 86 QT Interval:  386 QTC Calculation: 429 R Axis:   12 Text Interpretation: Sinus rhythm Abnormal R-wave progression, early transition No significant change since last tracing Confirmed by Calvert Cantor [phone]) on 11/20/2022 3:00:53 AM  Procedures Procedures  Medications Ordered in the ED Medications - No data to display  Initial Impression and Plan  Patient here with left sided headache and vague neuro symptoms which have mostly resolved. Now having some mild paresthesia of LUE but no objective neuro deficit on exam. She was noted to be  quite hypertensive in triage so consider complex migraine vs ICH. Will check labs for signs of end organ damage and send for head CT.   ED Course   Clinical Course as of 11/20/22 0651  Thu Nov 20, 2022  2595 I personally viewed the images from radiology studies and agree with radiologist interpretation: CT is normal.   [CS]  6387 CBC with mild anemia, otherwise unremarkable.  [CS]  5643 BMP is normal.  [CS]  3295 Patient still reports some tingling in L arm. Workup here is unremarkable. Discussed need for MRI to complete her ED workup including transfer to [facility] and she is amenable. Husband will take her to the [facility] for [facility]. Dr. Sedonia Small is aware.  [CS]    Clinical Course User Index [CS] Truddie Hidden, MD     MDM Rules/Calculators/A&P Medical Decision Making Problems Addressed: Acute nonintractable headache, unspecified headache type: acute illness or injury Hypertension, unspecified type: acute illness or injury Paresthesia: acute illness or injury  Amount and/or Complexity of Data Reviewed Labs: ordered. Decision-making details documented in ED Course. Radiology: ordered and independent interpretation performed. Decision-making details documented in ED Course. ECG/medicine tests: ordered and independent interpretation performed. Decision-making details documented in ED Course.  Risk Decision regarding hospitalization.     Final Clinical Impression(s) / ED Diagnoses Final diagnoses:  Acute nonintractable headache, unspecified headache type  Hypertension, unspecified type  Paresthesia    Rx / DC Orders ED Discharge Orders     None        Truddie Hidden, MD 11/20/22 [phone]

## 2022-11-20 NOTE — ED Provider Notes (Signed)
Care assumed from Dr. Karle Starch, patient transferred from [facility] for MRI.,  Please see Dr. Lilli Few note for full details, but in brief Shirley [name] is a 42 y.o. female With underlying history of hypertension, migraines, POTS and Ehlers-Danlos, who presents with left-sided headache associated with a brief episode of right arm weakness and slurred speech earlier in the day as well as a heavy feeling in her left arm.  Patient was quite hypertensive on arrival, CT head done at outside ED without evidence of hemorrhage.  Given patient's hypertension and intermittent neurologic symptoms patient sent for MRI.  If negative suspect complex migraine and patient can be discharged with outpatient neurology follow-up.  BP (!) 180/100   Pulse 63   Temp 98 F (36.7 C)   Resp 18   Ht 5\' 10"  (1.778 m)   Wt 69.9 kg   LMP 10/21/2022   SpO2 98%   BMI 22.10 kg/m    ED Course / MDM   Labs Reviewed  BASIC METABOLIC PANEL - Abnormal; Notable for the following components:      Result Value   Creatinine, Ser 1.09 (*)    All other components within normal limits  CBC WITH DIFFERENTIAL/PLATELET - Abnormal; Notable for the following components:   RBC 3.37 (*)    Hemoglobin 11.2 (*)    HCT 32.4 (*)    All other components within normal limits   MR BRAIN WO CONTRAST  Result Date: 11/20/2022 CLINICAL DATA:  Headache, neuro deficit. Migraine with slurred speech and left arm numbness. EXAM: MRI HEAD WITHOUT CONTRAST TECHNIQUE: Multiplanar, multiecho pulse sequences of the brain and surrounding structures were obtained without intravenous contrast. COMPARISON:  Head CT 11/20/2022 and MRI 10/10/2014 FINDINGS: Brain: There is no evidence of an acute infarct, intracranial hemorrhage, mass, midline shift, or extra-axial fluid collection. The ventricles and sulci are normal. The cerebellar tonsils are normally positioned. The brain is normal in signal. Vascular: Major intracranial vascular flow voids are  preserved. Skull and upper cervical spine: Unremarkable bone marrow signal. Sinuses/Orbits: Unremarkable orbits. Clear paranasal sinuses. Small right mastoid effusion. Other: None. IMPRESSION: Unremarkable appearance of the brain. Electronically Signed   By: Logan Bores M.D.   On: 11/20/2022 07:55   CT Head Wo Contrast  Result Date: 11/20/2022 CLINICAL DATA:  Headache, migraine EXAM: CT HEAD WITHOUT CONTRAST TECHNIQUE: Contiguous axial images were obtained from the base of the skull through the vertex without intravenous contrast. RADIATION DOSE REDUCTION: This exam was performed according to the departmental dose-optimization program which includes automated exposure control, adjustment of the mA and/or kV according to patient size and/or use of iterative reconstruction technique. COMPARISON:  06/02/2016 FINDINGS: Brain: No evidence of acute infarction, hemorrhage, hydrocephalus, extra-axial collection or mass lesion/mass effect. Vascular: No hyperdense vessel or unexpected calcification. Skull: Normal. Negative for fracture or focal lesion. Sinuses/Orbits: The visualized paranasal sinuses are essentially clear. The mastoid air cells are unopacified. Other: None. IMPRESSION: Normal head CT. Electronically Signed   By: Julian Hy M.D.   On: 11/20/2022 03:37     Medical Decision Making Amount and/or Complexity of Data Reviewed Labs: ordered. Radiology: ordered.  Risk Prescription drug management.   MRI completed, independently viewed and agree with radiologist interpretation, no evidence of stroke, bleeding or mass.  Patient still complaining of some headache and arm tingling.  Suspect complicated migraine, will give migraine cocktail.  Patient's blood pressure continues to be elevated but is improved from initial BP on arrival, currently 329 systolic.  Will  reassess after headache is improved.  Patient is currently on clonidine patch for blood pressure management.  On re-eval after migraine  cocktail headache and neuro symptoms have completely resoled and BP is 158/85. Pt is stable for DC home with outpatient neuro referral.     Janet Berlin 11/20/22 [place], [place], DO 11/21/22 [phone]

## 2022-11-20 NOTE — ED Notes (Signed)
Patient transported to CT 

## 2022-11-20 NOTE — ED Triage Notes (Signed)
Pt arrives via POV from [facility] for MRI for evaluation of migraine with slurred speech and left arm numbness. Pt is A&Ox4 on arrival. No neuro deficits.

## 2022-11-20 NOTE — ED Notes (Signed)
MRI called informing of pt's arrival.

## 2022-11-20 NOTE — ED Triage Notes (Signed)
Pt states she has had a migraine for a day and a half  Yesterday she states she had slurred speech and could not control her right hand   Pt states that resolved but today she has numbness in her left arm down to her hand

## 2022-11-20 NOTE — Discharge Instructions (Addendum)
Your MRI today was reassuring without evidence of stroke or mass.  I have put in a referral to neurology for further management of your migraines.  Continue to follow-up with your cardiologist and/your primary care doctor for blood pressure management, your blood pressure has significantly improved with resolution of your headache.  Return for new or worsening headaches, persistent numbness, weakness, speech or vision changes or other new or concerning symptoms.

## 2022-11-21 ENCOUNTER — Telehealth: Payer: Self-pay | Admitting: *Deleted

## 2022-11-21 NOTE — Telephone Encounter (Signed)
-----   Message from Lauralee Evener, [place] sent at 11/10/2022  9:55 AM EST -----  ----- Message ----- From: Sueanne Margarita, MD Sent: 11/09/2022   4:59 PM EST To: Cv Div Sleep Studies  Please let patient know that sleep study showed no significant sleep apnea.

## 2022-11-21 NOTE — Telephone Encounter (Signed)
The patient has been notified of the result. Left detailed message on voicemail and informed patient to call back..Ladeidra Borys Green, CMA    Pt had  normal results. 

## 2023-02-02 ENCOUNTER — Institutional Professional Consult (permissible substitution): Payer: Medicaid Other | Admitting: Neurology

## 2023-02-10 ENCOUNTER — Encounter: Payer: Self-pay | Admitting: Internal Medicine

## 2023-02-10 ENCOUNTER — Ambulatory Visit: Payer: BC Managed Care – PPO | Attending: Internal Medicine | Admitting: Internal Medicine

## 2023-02-10 VITALS — BP 150/96 | HR 75 | Ht 70.0 in | Wt 155.8 lb

## 2023-02-10 DIAGNOSIS — I10 Essential (primary) hypertension: Secondary | ICD-10-CM

## 2023-02-10 DIAGNOSIS — R002 Palpitations: Secondary | ICD-10-CM | POA: Diagnosis not present

## 2023-02-10 MED ORDER — HYDRALAZINE HCL 25 MG PO TABS: 25.0000 mg | ORAL_TABLET | Freq: Two times a day (BID) | ORAL | 3 refills | Status: DC

## 2023-02-10 MED ORDER — CLONIDINE HCL 0.1 MG PO TABS
0.1000 mg | ORAL_TABLET | Freq: Once | ORAL | Status: AC
  Administered 2023-02-10: 0.1 mg via ORAL

## 2023-02-10 MED ORDER — CLONIDINE HCL 0.3 MG PO TABS: 0.3000 mg | ORAL_TABLET | Freq: Two times a day (BID) | ORAL | 2 refills | Status: DC

## 2023-02-10 NOTE — Patient Instructions (Addendum)
Medication Instructions:  Your physician has recommended you make the following change in your medication:   ** Stop Clonidine Patch  ** Begin Clonidine 0.3mg  - 1 tablet by mouth twice daily  ** Begin Hydralazine  - 1 tablet by mouth twice daily   *If you need a refill on your cardiac medications before your next appointment, please call your pharmacy*   Lab Work: None ordered.  If you have labs (blood work) drawn today and your tests are completely normal, you will receive your results only by: MyChart Message (if you have MyChart) OR A paper copy in the mail If you have any lab test that is abnormal or we need to change your treatment, we will call you to review the results.   Testing/Procedures: None ordered.    Follow-Up: At [facility], you and your health needs are our priority.  As part of our continuing mission to provide you with exceptional heart care, we have created designated Provider Care Teams.  These Care Teams include your primary Cardiologist (physician) and Advanced Practice Providers (APPs -  Physician Assistants and Nurse Practitioners) who all work together to provide you with the care you need, when you need it.    Your next appointment:  6 mths with Dr Graciela Husbands      Other Instructions - Someone will call you to schedule these appointments  ** Referred to PharmD team for HTN   ** Referred to [facility] for [facility]

## 2023-02-10 NOTE — Progress Notes (Unsigned)
        Patient Care Team: [facility], [place] as PCP - General (Internal Medicine)   HPI  Shirley [name] is a 42 y.o. female seen in follow-up for hypertension presyncope with joint hypermobility syndrome who had carried a diagnosis of POTS  and symptomatic PVCs  for which we could not find supportive data   Complaints of fatigue all the time.  Not improved with rest.  Sleep study has been negative.  Previous on amlodipine also not well-tolerated because of fatigue.   Interval hospitalization for transient left-sided weakness; headache   And dysarthria evaluation was largely negative with an outside CT showing no evidence of hemorrhage; MRI scan was unremarkable   Propranolol poorly tolerated, better with clonidine  Comes in today just frustrated that she feels tired all the time exercise intolerance and headaches  Records and Results Reviewed  Past Medical History:  Diagnosis Date   Double vision    Medical history non-contributory    Migraines    Numbness    Severe menstrual cramps    Weakness of hand     Past Surgical History:  Procedure Laterality Date   BREAST LUMPECTOMY     DILATION AND CURETTAGE OF UTERUS     LAPAROSCOPIC TUBAL LIGATION Bilateral 08/05/2013   Procedure: LAPAROSCOPIC TUBAL LIGATION;  Surgeon: Antionette Char, MD;  Location: [facility];  Service: Gynecology;  Laterality: Bilateral;   rods in feet     ROTATOR CUFF REPAIR      No outpatient medications have been marked as taking for the 02/10/23 encounter (Office Visit) with Duke Salvia, MD.    No Known Allergies    Review of Systems negative except from HPI and PMH  Physical Exam BP 118/66   Pulse 75   Ht  (1.778 m)   Wt 155 lb 12.8 oz (70.7 kg)   BMI 22.35 kg/m  Well developed and well nourished in no acute distress HENT normal E scleral and icterus clear Neck Supple JVP flat; carotids brisk and full Clear to ausculation Regular rate and rhythm, no murmurs  gallops or rub Soft with active bowel sounds No clubbing cyanosis  Edema Alert and oriented, grossly normal motor and sensory function tears Skin Warm and Dry  ECG sinus at 75 Interval 16/08/38  CrCl cannot be calculated (Patient's most recent lab result is older than the maximum 21 days allowed.).   Assessment and  Plan Hypertension-- severe   Exertional tachypalpitations   Syncope and presyncope   Gait instability   Joint hypermobility syndrome    Blood pressure poorly controlled.  Will try her oral clonidine to see if it will be effective, not sure in the context of her clonidine patch.   She has received clonidine 0.1 mg every 30 minutes x 3.  Blood pressure now is 150/96.  We will discharge her to home to obtain a oral prescription for clonidine 0.3 twice daily and hydralazine 25 twice daily.  Will reach her to the hypertension clinic into the blood pressure pharmacist.  Followup call 4/24 -- going to get cuff   Current medicines are reviewed at length with the patient today .  The patient does not  have concerns regarding medicines.

## 2023-02-13 MED ORDER — HYDRALAZINE HCL 50 MG PO TABS: 50.0000 mg | ORAL_TABLET | Freq: Two times a day (BID) | ORAL | 2 refills | Status: AC

## 2023-03-06 ENCOUNTER — Ambulatory Visit: Payer: BC Managed Care – PPO

## 2023-03-10 ENCOUNTER — Ambulatory Visit: Payer: Medicaid Other | Admitting: Internal Medicine

## 2023-03-26 ENCOUNTER — Ambulatory Visit: Payer: BC Managed Care – PPO

## 2023-03-26 ENCOUNTER — Institutional Professional Consult (permissible substitution) (HOSPITAL_BASED_OUTPATIENT_CLINIC_OR_DEPARTMENT_OTHER): Payer: BC Managed Care – PPO | Admitting: Family

## 2023-04-08 ENCOUNTER — Encounter (HOSPITAL_BASED_OUTPATIENT_CLINIC_OR_DEPARTMENT_OTHER): Payer: Self-pay | Admitting: Cardiovascular Disease

## 2023-05-27 ENCOUNTER — Other Ambulatory Visit: Payer: Self-pay | Admitting: Internal Medicine

## 2023-06-25 ENCOUNTER — Other Ambulatory Visit: Payer: Self-pay | Admitting: Internal Medicine

## 2023-08-26 ENCOUNTER — Other Ambulatory Visit: Payer: Self-pay | Admitting: Internal Medicine

## 2023-11-28 ENCOUNTER — Other Ambulatory Visit: Payer: Self-pay | Admitting: Internal Medicine

## 2024-07-15 ENCOUNTER — Other Ambulatory Visit: Payer: Self-pay | Admitting: Internal Medicine

## 2024-08-16 ENCOUNTER — Other Ambulatory Visit: Payer: Self-pay | Admitting: Student

## 2024-08-30 ENCOUNTER — Other Ambulatory Visit: Payer: Self-pay | Admitting: Student
